# Patient Record
Sex: Female | Born: 1970 | Race: White | Hispanic: No | Smoking: Current every day smoker
Health system: Southern US, Community
[De-identification: ages and names within clinical notes are randomized; demographics above are authoritative.]

## PROBLEM LIST (undated history)

## (undated) DIAGNOSIS — M5430 Sciatica, unspecified side: Secondary | ICD-10-CM

## (undated) DIAGNOSIS — N83209 Unspecified ovarian cyst, unspecified side: Secondary | ICD-10-CM

## (undated) DIAGNOSIS — IMO0001 Reserved for inherently not codable concepts without codable children: Secondary | ICD-10-CM

## (undated) DIAGNOSIS — F909 Attention-deficit hyperactivity disorder, unspecified type: Secondary | ICD-10-CM

## (undated) DIAGNOSIS — T4145XA Adverse effect of unspecified anesthetic, initial encounter: Secondary | ICD-10-CM

## (undated) DIAGNOSIS — M549 Dorsalgia, unspecified: Secondary | ICD-10-CM

## (undated) DIAGNOSIS — F329 Major depressive disorder, single episode, unspecified: Secondary | ICD-10-CM

## (undated) DIAGNOSIS — C801 Malignant (primary) neoplasm, unspecified: Secondary | ICD-10-CM

## (undated) DIAGNOSIS — M1990 Unspecified osteoarthritis, unspecified site: Secondary | ICD-10-CM

## (undated) DIAGNOSIS — R569 Unspecified convulsions: Secondary | ICD-10-CM

## (undated) DIAGNOSIS — G8929 Other chronic pain: Secondary | ICD-10-CM

## (undated) DIAGNOSIS — T8859XA Other complications of anesthesia, initial encounter: Secondary | ICD-10-CM

## (undated) DIAGNOSIS — F32A Depression, unspecified: Secondary | ICD-10-CM

## (undated) DIAGNOSIS — F419 Anxiety disorder, unspecified: Secondary | ICD-10-CM

## (undated) DIAGNOSIS — K219 Gastro-esophageal reflux disease without esophagitis: Secondary | ICD-10-CM

## (undated) HISTORY — PX: ABDOMINAL HYSTERECTOMY: SHX81

## (undated) HISTORY — PX: BLADDER SURGERY: SHX569

## (undated) HISTORY — PX: CHOLECYSTECTOMY: SHX55

---

## 2001-06-10 ENCOUNTER — Inpatient Hospital Stay (HOSPITAL_COMMUNITY): Admission: EM | Admit: 2001-06-10 | Discharge: 2001-06-13 | Payer: Self-pay | Admitting: *Deleted

## 2001-12-09 ENCOUNTER — Encounter: Admission: RE | Admit: 2001-12-09 | Discharge: 2002-03-09 | Payer: Self-pay

## 2002-01-08 ENCOUNTER — Emergency Department (HOSPITAL_COMMUNITY): Admission: EM | Admit: 2002-01-08 | Discharge: 2002-01-08 | Payer: Self-pay | Admitting: Emergency Medicine

## 2007-04-05 ENCOUNTER — Emergency Department (HOSPITAL_COMMUNITY): Admission: EM | Admit: 2007-04-05 | Discharge: 2007-04-05 | Payer: Self-pay | Admitting: Emergency Medicine

## 2007-04-30 ENCOUNTER — Emergency Department (HOSPITAL_COMMUNITY): Admission: EM | Admit: 2007-04-30 | Discharge: 2007-04-30 | Payer: Self-pay | Admitting: *Deleted

## 2007-05-29 ENCOUNTER — Emergency Department (HOSPITAL_COMMUNITY): Admission: EM | Admit: 2007-05-29 | Discharge: 2007-05-29 | Payer: Self-pay | Admitting: Emergency Medicine

## 2007-07-01 ENCOUNTER — Emergency Department (HOSPITAL_COMMUNITY): Admission: EM | Admit: 2007-07-01 | Discharge: 2007-07-01 | Payer: Self-pay | Admitting: Emergency Medicine

## 2007-07-13 ENCOUNTER — Emergency Department (HOSPITAL_COMMUNITY): Admission: EM | Admit: 2007-07-13 | Discharge: 2007-07-13 | Payer: Self-pay | Admitting: Emergency Medicine

## 2007-08-04 ENCOUNTER — Emergency Department (HOSPITAL_COMMUNITY): Admission: EM | Admit: 2007-08-04 | Discharge: 2007-08-04 | Payer: Self-pay | Admitting: Emergency Medicine

## 2007-09-13 ENCOUNTER — Emergency Department (HOSPITAL_COMMUNITY): Admission: EM | Admit: 2007-09-13 | Discharge: 2007-09-13 | Payer: Self-pay | Admitting: *Deleted
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
## 2011-03-13 NOTE — Discharge Summary (Signed)
[facility]  Patient:    Katie Brady, Katie Brady Visit Number: [id number] MRN: [id number]          Service Type: PSY Location: 30 0303 02 Attending Physician:  Denny Peon Dictated by:   Netta Cedars, M.D. Admit Date:  06/10/2001 Discharge Date: 06/13/2001                             Discharge Summary  INTRODUCTION:  Katie Brady Gary is a 40 year old divorced white female mother of 95-year-old twins who lives with her fiancee.  The patient was admitted from [facility] after she overdosed on Soma and some pain relievers.  She denied suicidal intent.  She thought that she simply wanted to ease her chronic pain.  The patient has experienced chronic back pain for over two years after she suffered an injury at work.  The patient admits to feeling down secondary to problems with pain, financial difficulties, and some conflict within the family.  The patient complains of initial insomnia and constant worries.  Denied homicidal or suicidal ideation or hallucinations. The patient denied previous treatment with medication but was under the care of therapist at the mental health center.  She overdosed in January 2002 on medication but she considered this as an accident, not an overdose on purpose. The patient was treated by her neurologist with Xanax and Neurontin.  Her neurologist is Dr. Jerald Kief.  SOCIAL HISTORY:  The patient is unemployed, presently on disability.  Has lived with fiancee for the past two years.  He is supportive but from time to time they do have conflict.  She has some family support from mother.  The patient has some legal problems related to her attempt to obtain prescription narcotics.  FAMILY HISTORY:  Depression and alcoholism both and mothers and fathers side.  SUBSTANCE ABUSE HISTORY:  The patient drinks occasionally.  Marijuana on occasions, not recently.  There is some indication of the patient abusing pain relievers and  muscle relaxants.  MEDICAL HISTORY:  As mentioned, she suffered an injury before to her back and is followed by Dr. Jerald Kief, neurologist, for several years.  PRESENT MEDICATIONS: 1. Keppra. 2. Xanax. 3. Percocet. 4. Neurontin.  DRUG ALLERGIES:  The patient is allergic to SOMA.  PHYSICAL EXAMINATION:  GENERAL:  Done at [facility], was normal.  The patient was recently treated for bronchitis, being on antibiotics for two weeks.  LABORATORY DATA:  There was some elevation of SGOT and SGPT.  MENTAL STATUS EXAMINATION:  A medium built white female who looked her age, casually dressed and groomed, normal motor activity, good eye contact.  Denied hallucinations.  Thoughts were organized and goal directed.  Content did not reveal suicidal, homicidal, or any other dangerous ideations, no delusions, no ideas of reference, no symptoms of OCD.  Mood was described as being okay. Affect was anxious and low.  Alert and oriented x 3.  Normal intelligence. Memory was okay x 3.  Concentration: Decreased.  Insight was partial. Judgment: Impaired.  Reliability: Uncertain.  DIAGNOSES: Axis I:    1. Depressive disorder, not otherwise specified.            2. Rule out depressive disorder due to medical problems, chronic               pain.            3. Polysubstance abuse. Axis II:   Personality disorder, not otherwise  specified. Axis III:  1. Chronic back pain.            2. Status post recent overdose. Axis IV:   Moderate stressors, problems with primary support group, financial            problems, medical problems. Axis V:    Global assessment of functioning upon admission was 40, maximum for            the past year was between 65 and 70.  HOSPITAL COURSE:   The patient was able to contract for safety and she strongly denied suicidal ideations.  Initial problem included organizing a meeting with the patients fiancee to provide a safe environment after discharge and to return  to previous dose of Celexa and Keppra.  Would try to decrease dose of Xanax and Klonopin.  The patient was placed on special observation.  On August 18, dose was increased to 20 mg daily.  In fact, already on August 18, she was doing well, "flying into health," feeling that she did not have problems anymore and that she would be alright.  There was some elevation of SGPT in lab work; otherwise, blood work was normal.  Since the patient denied any dangerous ideations and was doing well, feeling that if family meeting goes alright she could go home the next day.  On August 18, meeting took place.  The patient presented with bright affect, seemed like boyfriend was supportive and understanding to her difficulties.  On August 19, presented with bright affect, absence of dangerous ideations or psychosis, felt ready for discharge, tolerated medication well with no side effects.  She agreed for followup with mental health center.  MEDICAL PROBLEMS DURING THIS HOSPITAL STAY:  The patient did not have medical problems.  Vital signs were stable.  Initially, she ran a low grade fever on August 16 and August 17 but temperature normalized during the next two days of hospitalization.  Blood pressure was 124/7, normal pulse and respiratory rate. There were no signs of physical distress.  LABORATORY DATA:  Slight elevation of ALT 91, normal AST and the rest of liver panel was normal.  Slight elevation of white blood count 13.0 on August 18 and otherwise normal.  TSH normal.  Acute hepatitis panel, anti-HAV, IgM negative; hepatitis B core immunoglobulin negative.  DISCHARGE DIAGNOSES: Axis I:    1. Depressive disorder, not otherwise specified.            2. Polysubstance abuse. Axis II:   Personality disorder, not otherwise specified, with antisocial            traits. Axis III:  1. Chronic back pain.            2. Status post overdose. Axis IV:   Moderate problems related to primary support group  and chronic            medical problems.  Axis V:    Global assessment of functioning upon admission was 40, upon            discharge 60, maximum for the past year was between 65 and 70.  DISCHARGE MEDICATIONS: 1. Keppra 500 mg twice a day. 2. Klonopin 0.5 mg twice a day. 3. Celexa 20 mg daily.  DISCHARGE RECOMMENDATIONS:  The patients boyfriend will secure medications. She should call mental health if problems with medications or recurrence of symptoms.  The patient should follow up with her neurologist.  She has an appointment scheduled in the [place] office with  Dr. Milford Cage on September 30 and [facility] on August 27 with her therapist at 9 a.m.  The patient was supposed to attend NA meetings.  The patient understood instructions, at the time of discharge did not have any side effects from medication and in good condition she was discharged home in the care of her boyfriend. Dictated by:   Netta Cedars, M.D. Attending Physician:  Denny Peon DD:  07/13/01 TD:  07/14/01 Job: [id number]
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
## 2011-06-17 ENCOUNTER — Encounter: Payer: Self-pay | Admitting: *Deleted

## 2011-06-17 ENCOUNTER — Emergency Department (HOSPITAL_COMMUNITY)
Admission: EM | Admit: 2011-06-17 | Discharge: 2011-06-18 | Disposition: A | Discharge status: Home / Self Care | Payer: Medicare Other | Attending: Emergency Medicine | Admitting: Emergency Medicine

## 2011-06-17 DIAGNOSIS — F17200 Nicotine dependence, unspecified, uncomplicated: Secondary | ICD-10-CM | POA: Insufficient documentation

## 2011-06-17 DIAGNOSIS — R05 Cough: Secondary | ICD-10-CM

## 2011-06-17 DIAGNOSIS — R059 Cough, unspecified: Secondary | ICD-10-CM | POA: Insufficient documentation

## 2011-06-17 DIAGNOSIS — B9789 Other viral agents as the cause of diseases classified elsewhere: Secondary | ICD-10-CM | POA: Insufficient documentation

## 2011-06-17 DIAGNOSIS — B349 Viral infection, unspecified: Secondary | ICD-10-CM

## 2011-06-17 NOTE — ED Notes (Signed)
Pt has multiple complaints, pt c/o headache and cough; pt also c/o left lower abd pain x 1 week

## 2011-06-18 ENCOUNTER — Emergency Department (HOSPITAL_COMMUNITY): Payer: Medicare Other

## 2011-06-18 LAB — URINALYSIS, ROUTINE W REFLEX MICROSCOPIC
Hgb urine dipstick: NEGATIVE
Ketones, ur: NEGATIVE mg/dL
Leukocytes, UA: NEGATIVE
Protein, ur: NEGATIVE mg/dL
Urobilinogen, UA: 0.2 mg/dL (ref 0.0–1.0)

## 2011-06-18 MED ORDER — HYDROCODONE-ACETAMINOPHEN 5-325 MG PO TABS
1.0000 | ORAL_TABLET | Freq: Once | ORAL | Status: AC
  Administered 2011-06-18: 1 via ORAL

## 2011-06-18 MED ORDER — ALBUTEROL SULFATE (5 MG/ML) 0.5% IN NEBU
2.5000 mg | INHALATION_SOLUTION | Freq: Once | RESPIRATORY_TRACT | Status: AC
  Administered 2011-06-18: 2.5 mg via RESPIRATORY_TRACT

## 2011-06-18 MED ORDER — GUAIFENESIN-CODEINE 100-10 MG/5ML PO SYRP: 5.0000 mL | ORAL_SOLUTION | Freq: Three times a day (TID) | ORAL | Status: AC | PRN

## 2011-06-18 MED ORDER — GUAIFENESIN-CODEINE 100-10 MG/5ML PO SOLN
5.0000 mL | Freq: Once | ORAL | Status: AC
  Administered 2011-06-18: 5 mL via ORAL

## 2011-06-18 MED ORDER — IBUPROFEN 800 MG PO TABS
800.0000 mg | ORAL_TABLET | Freq: Once | ORAL | Status: AC
  Administered 2011-06-18: 800 mg via ORAL
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
## 2011-08-04 LAB — URINALYSIS, ROUTINE W REFLEX MICROSCOPIC
Bilirubin Urine: NEGATIVE
Glucose, UA: NEGATIVE
Ketones, ur: 15 — AB
Leukocytes, UA: NEGATIVE
Protein, ur: NEGATIVE
pH: 6

## 2011-08-04 LAB — URINE MICROSCOPIC-ADD ON

## 2011-08-06 LAB — URINALYSIS, ROUTINE W REFLEX MICROSCOPIC
Bilirubin Urine: NEGATIVE
Glucose, UA: NEGATIVE
Glucose, UA: NEGATIVE
Nitrite: NEGATIVE
Nitrite: NEGATIVE
Specific Gravity, Urine: 1.01
pH: 7

## 2011-08-06 LAB — URINE MICROSCOPIC-ADD ON

## 2011-08-07 LAB — DIFFERENTIAL
Basophils Absolute: 0.1
Basophils Relative: 1
Eosinophils Absolute: 0.1
Eosinophils Relative: 1
Lymphocytes Relative: 29
Lymphs Abs: 2
Monocytes Absolute: 0.4
Monocytes Relative: 6
Neutro Abs: 4.3
Neutrophils Relative %: 63

## 2011-08-07 LAB — URINALYSIS, ROUTINE W REFLEX MICROSCOPIC
Bilirubin Urine: NEGATIVE
Ketones, ur: NEGATIVE
Nitrite: NEGATIVE
Protein, ur: NEGATIVE
Specific Gravity, Urine: <1.005 — ABNORMAL LOW
Urobilinogen, UA: 0.2

## 2011-08-07 LAB — CBC
Hemoglobin: 12.4
MCHC: 34.5
RDW: 14.4 — ABNORMAL HIGH

## 2011-08-07 LAB — COMPREHENSIVE METABOLIC PANEL
ALT: 22
Calcium: 9
Creatinine, Ser: 0.63
GFR calc Af Amer: >60
Glucose, Bld: 93
Sodium: 138
Total Protein: 6.4

## 2011-08-07 LAB — PREGNANCY, URINE: Preg Test, Ur: NEGATIVE

## 2011-08-07 LAB — LIPASE, BLOOD: Lipase: 15

## 2011-08-07 LAB — URINE MICROSCOPIC-ADD ON

## 2011-08-10 LAB — BASIC METABOLIC PANEL
CO2: 25
Calcium: 8.7
Creatinine, Ser: 0.75
GFR calc Af Amer: >60
GFR calc non Af Amer: >60
Glucose, Bld: 109 — ABNORMAL HIGH

## 2011-08-10 LAB — CBC
HCT: 38
Hemoglobin: 13.1
MCHC: 34.4
MCV: 93.2
Platelets: 275
RBC: 4.07
RDW: 14
WBC: 7.3

## 2011-08-10 LAB — URINALYSIS, ROUTINE W REFLEX MICROSCOPIC
Bilirubin Urine: NEGATIVE
Glucose, UA: NEGATIVE
Ketones, ur: NEGATIVE
Leukocytes, UA: NEGATIVE
Nitrite: NEGATIVE
Protein, ur: NEGATIVE
Specific Gravity, Urine: <1.005 — ABNORMAL LOW
Urobilinogen, UA: 0.2
pH: 6

## 2011-08-10 LAB — DIFFERENTIAL
Basophils Absolute: 0.1
Basophils Relative: 1
Lymphocytes Relative: 27
Neutro Abs: 4.9
Neutrophils Relative %: 68

## 2011-08-10 LAB — URINE MICROSCOPIC-ADD ON

## 2011-08-10 LAB — PREGNANCY, URINE: Preg Test, Ur: NEGATIVE

## 2011-08-11 LAB — BASIC METABOLIC PANEL
GFR calc non Af Amer: >60
Potassium: 3.8
Sodium: 136

## 2011-08-11 LAB — CBC
HCT: 40.1
Hemoglobin: 13.8
RBC: 4.34
WBC: 9.3

## 2011-08-11 LAB — DIFFERENTIAL
Eosinophils Relative: 0
Lymphocytes Relative: 22
Lymphs Abs: 2
Monocytes Absolute: 0.4
Monocytes Relative: 4

## 2011-08-11 LAB — PREGNANCY, URINE: Preg Test, Ur: NEGATIVE

## 2011-08-11 LAB — URINALYSIS, ROUTINE W REFLEX MICROSCOPIC
Bilirubin Urine: NEGATIVE
Nitrite: NEGATIVE
Specific Gravity, Urine: <1.005 — ABNORMAL LOW
pH: 6.5

## 2011-08-11 LAB — HEPATIC FUNCTION PANEL
AST: 12
Bilirubin, Direct: 0.1
Indirect Bilirubin: 0.3

## 2011-08-11 LAB — URINE MICROSCOPIC-ADD ON

## 2011-08-13 LAB — URINALYSIS, ROUTINE W REFLEX MICROSCOPIC
Ketones, ur: NEGATIVE
Nitrite: NEGATIVE
Protein, ur: NEGATIVE
Urobilinogen, UA: 0.2
pH: 6

## 2011-08-13 LAB — CBC
HCT: 36.3
MCHC: 35
MCV: 92.3
Platelets: 254
RDW: 13.6

## 2011-08-13 LAB — DIFFERENTIAL
Basophils Absolute: 0.1
Eosinophils Absolute: 0.1
Eosinophils Relative: 1

## 2011-08-13 LAB — PREGNANCY, URINE: Preg Test, Ur: NEGATIVE

## 2012-02-23 ENCOUNTER — Encounter (HOSPITAL_COMMUNITY): Payer: Self-pay | Admitting: Emergency Medicine

## 2012-02-23 ENCOUNTER — Emergency Department (HOSPITAL_COMMUNITY)
Admission: EM | Admit: 2012-02-23 | Discharge: 2012-02-24 | Disposition: A | Discharge status: Home / Self Care | Payer: Medicare Other | Attending: Emergency Medicine | Admitting: Emergency Medicine

## 2012-02-23 DIAGNOSIS — Z8543 Personal history of malignant neoplasm of ovary: Secondary | ICD-10-CM | POA: Insufficient documentation

## 2012-02-23 DIAGNOSIS — Z79899 Other long term (current) drug therapy: Secondary | ICD-10-CM | POA: Insufficient documentation

## 2012-02-23 DIAGNOSIS — F3289 Other specified depressive episodes: Secondary | ICD-10-CM | POA: Insufficient documentation

## 2012-02-23 DIAGNOSIS — M069 Rheumatoid arthritis, unspecified: Secondary | ICD-10-CM | POA: Insufficient documentation

## 2012-02-23 DIAGNOSIS — IMO0002 Reserved for concepts with insufficient information to code with codable children: Secondary | ICD-10-CM | POA: Insufficient documentation

## 2012-02-23 DIAGNOSIS — F329 Major depressive disorder, single episode, unspecified: Secondary | ICD-10-CM | POA: Insufficient documentation

## 2012-02-23 DIAGNOSIS — S1091XA Abrasion of unspecified part of neck, initial encounter: Secondary | ICD-10-CM

## 2012-02-23 DIAGNOSIS — X789XXA Intentional self-harm by unspecified sharp object, initial encounter: Secondary | ICD-10-CM | POA: Insufficient documentation

## 2012-02-23 DIAGNOSIS — Z639 Problem related to primary support group, unspecified: Secondary | ICD-10-CM | POA: Insufficient documentation

## 2012-02-23 DIAGNOSIS — R4589 Other symptoms and signs involving emotional state: Secondary | ICD-10-CM

## 2012-02-23 DIAGNOSIS — G40909 Epilepsy, unspecified, not intractable, without status epilepticus: Secondary | ICD-10-CM | POA: Insufficient documentation

## 2012-02-23 DIAGNOSIS — F489 Nonpsychotic mental disorder, unspecified: Secondary | ICD-10-CM | POA: Insufficient documentation

## 2012-02-23 HISTORY — DX: Major depressive disorder, single episode, unspecified: F32.9

## 2012-02-23 HISTORY — DX: Unspecified convulsions: R56.9

## 2012-02-23 HISTORY — DX: Depression, unspecified: F32.A

## 2012-02-23 LAB — URINALYSIS, ROUTINE W REFLEX MICROSCOPIC
Bilirubin Urine: NEGATIVE
Glucose, UA: NEGATIVE mg/dL
Hgb urine dipstick: NEGATIVE
Ketones, ur: NEGATIVE mg/dL
Leukocytes, UA: NEGATIVE
Nitrite: NEGATIVE
Protein, ur: NEGATIVE mg/dL
Specific Gravity, Urine: <1.005 — ABNORMAL LOW (ref 1.005–1.030)
Urobilinogen, UA: 0.2 mg/dL (ref 0.0–1.0)
pH: 6 (ref 5.0–8.0)

## 2012-02-23 LAB — CBC
HCT: 37.9 % (ref 36.0–46.0)
Hemoglobin: 12.9 g/dL (ref 12.0–15.0)
MCH: 31.7 pg (ref 26.0–34.0)
MCHC: 34 g/dL (ref 30.0–36.0)
MCV: 93.1 fL (ref 78.0–100.0)
Platelets: 275 10*3/uL (ref 150–400)
RBC: 4.07 MIL/uL (ref 3.87–5.11)
RDW: 13.9 % (ref 11.5–15.5)
WBC: 12 10*3/uL — ABNORMAL HIGH (ref 4.0–10.5)

## 2012-02-23 LAB — RAPID URINE DRUG SCREEN, HOSP PERFORMED
Amphetamines: NOT DETECTED
Barbiturates: NOT DETECTED
Benzodiazepines: POSITIVE — AB
Cocaine: NOT DETECTED
Opiates: NOT DETECTED
Tetrahydrocannabinol: NOT DETECTED

## 2012-02-23 LAB — BASIC METABOLIC PANEL
BUN: 10 mg/dL (ref 6–23)
CO2: 26 mEq/L (ref 19–32)
Calcium: 9.1 mg/dL (ref 8.4–10.5)
Chloride: 106 mEq/L (ref 96–112)
Creatinine, Ser: 0.84 mg/dL (ref 0.50–1.10)
GFR calc Af Amer: >90 mL/min (ref >90)
GFR calc non Af Amer: 84 mL/min — ABNORMAL LOW (ref >90)
Glucose, Bld: 87 mg/dL (ref 70–99)
Potassium: 3 mEq/L — ABNORMAL LOW (ref 3.5–5.1)
Sodium: 143 mEq/L (ref 135–145)

## 2012-02-23 LAB — ETHANOL: Alcohol, Ethyl (B): 96 mg/dL — ABNORMAL HIGH (ref 0–11)

## 2012-02-23 MED ORDER — ARIPIPRAZOLE 10 MG PO TABS
ORAL_TABLET | ORAL | Status: AC
  Filled 2012-02-23: qty 1

## 2012-02-23 MED ORDER — ARIPIPRAZOLE 10 MG PO TABS
10.0000 mg | ORAL_TABLET | Freq: Every day | ORAL | Status: DC
  Administered 2012-02-23 – 2012-02-24 (×2): 10 mg via ORAL
  Filled 2012-02-23 (×3): qty 1

## 2012-02-23 MED ORDER — LEVETIRACETAM 500 MG PO TABS
1000.0000 mg | ORAL_TABLET | Freq: Every day | ORAL | Status: DC
Start: 2012-02-23 — End: 2012-02-24
  Administered 2012-02-23 – 2012-02-24 (×2): 1000 mg via ORAL
  Filled 2012-02-23 (×3): qty 2

## 2012-02-23 MED ORDER — LINDANE 1 % EX LOTN
TOPICAL_LOTION | CUTANEOUS | Status: AC
  Filled 2012-02-23: qty 60

## 2012-02-23 MED ORDER — ALPRAZOLAM 0.5 MG PO TABS
1.0000 mg | ORAL_TABLET | Freq: Four times a day (QID) | ORAL | Status: DC
  Administered 2012-02-23 – 2012-02-24 (×3): 1 mg via ORAL
  Filled 2012-02-23: qty 1
  Filled 2012-02-23 (×2): qty 2
  Filled 2012-02-23: qty 1

## 2012-02-23 MED ORDER — ZOLPIDEM TARTRATE 5 MG PO TABS: 5.0000 mg | ORAL_TABLET | Freq: Every evening | ORAL | Status: DC | PRN

## 2012-02-23 MED ORDER — LINDANE 1 % EX SHAM
MEDICATED_SHAMPOO | CUTANEOUS | Status: AC
  Filled 2012-02-23: qty 60

## 2012-02-23 MED ORDER — LORAZEPAM 1 MG PO TABS: 1.0000 mg | ORAL_TABLET | Freq: Three times a day (TID) | ORAL | Status: DC | PRN

## 2012-02-23 MED ORDER — AMPHETAMINE-DEXTROAMPHETAMINE 10 MG PO TABS
30.0000 mg | ORAL_TABLET | Freq: Every day | ORAL | Status: DC
Start: 2012-02-24 — End: 2012-02-24
  Administered 2012-02-24: 30 mg via ORAL
  Filled 2012-02-23 (×2): qty 3

## 2012-02-23 MED ORDER — POTASSIUM CHLORIDE CRYS ER 20 MEQ PO TBCR
40.0000 meq | EXTENDED_RELEASE_TABLET | Freq: Once | ORAL | Status: AC
  Administered 2012-02-23: 40 meq via ORAL
  Filled 2012-02-23: qty 2

## 2012-02-23 MED ORDER — ONDANSETRON HCL 4 MG PO TABS: 4.0000 mg | ORAL_TABLET | Freq: Three times a day (TID) | ORAL | Status: DC | PRN

## 2012-02-23 MED ORDER — LEVETIRACETAM 500 MG PO TABS
ORAL_TABLET | ORAL | Status: AC
  Filled 2012-02-23: qty 2

## 2012-02-23 MED ORDER — ACETAMINOPHEN 325 MG PO TABS: 650.0000 mg | ORAL_TABLET | ORAL | Status: DC | PRN

## 2012-02-23 MED ORDER — LINDANE 1 % EX SHAM
MEDICATED_SHAMPOO | Freq: Once | CUTANEOUS | Status: AC
  Administered 2012-02-23: 21:00:00 via TOPICAL
  Filled 2012-02-23: qty 60
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
## 2012-02-23 NOTE — ED Notes (Signed)
Called AC to bring patient's medication.
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
## 2012-02-23 NOTE — ED Notes (Addendum)
Patient standing in hallway. Advised patient she needed to return to her room and stay in her room per HIPPA violation. Patient returned to room with no difficulty and is now sitting in bed.

## 2012-02-23 NOTE — ED Notes (Signed)
Patient requesting something to eat. Patient advocate getting patient a frozen dinner. Patient states she will try to sleep after she gets something to eat.

## 2012-02-23 NOTE — ED Notes (Signed)
Patient has lice knits in hair, small bites to back of neck. Patient states "I don't think so" when asked if she had lice in her hair. Patient also has 2 closed lacerations approximately 3-4 inches long. Patient denies cutting herself. No bleeding noted from lacerations.

## 2012-02-23 NOTE — ED Notes (Signed)
Notified by front desk that patient's mother and sister were here wanting to see patient. Patient stated she does not have a sister but wanted to see her mother. Security escorted a young lady to nursing station and lady stated she was patient's daughter. Denied daughter access to her mother due to comments made by patient being the reason she is here. Mother allowed to see patient.
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
## 2012-02-24 NOTE — ED Notes (Signed)
Patient states she wants her medicine RN Amber aware.

## 2012-02-24 NOTE — ED Notes (Signed)
Patient given frozen dinner and sprite. Patient sitting in bed eating at this time.

## 2012-02-24 NOTE — BH Assessment (Signed)
Assessment Note   Katie Brady is an 41 y.o. female. The patient came to the ED last night, after she had made a gesture of cutting her throat with a broken window pane. She denies that she wanted to kill herself. She was distraught that her daughter told her that she was romantically involved with the patients' live in boyfriend of 17 years. In her anger she had broken the window. The patient as a rule only drinks occassionally, but to calm down she had drank a 24 oz beer. The patient denies a history of substance abuse. She is not homicidal and denies any history of violence. She is not psychotic. She has no history of inpatient treatment. She is followed by Dr. Carroll Sage for medications. 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 The patient denies any suicidal ideation. She is willing to contract for safety.  Axis I: Major Depression, Recurrent severe Axis II: Deferred Axis III:  Past Medical History  Diagnosis Date  . Migraine   . Ovarian cancer   . Rheumatoid aortitis   . Depression   . Seizures    Axis IV: other psychosocial or environmental problems and problems with primary support group Axis V: 51-60 moderate symptoms  Past Medical History:  Past Medical History  Diagnosis Date  . Migraine   . Ovarian cancer   . Rheumatoid aortitis   . Depression   . Seizures     Past Surgical History  Procedure Date  . Abdominal hysterectomy   . Bladder surgery   . Cholecystectomy     Family History: History reviewed. No pertinent family history.  Social History:  reports that she has been smoking.  She does not have any smokeless tobacco history on file. She reports that she drinks alcohol. She reports that she does not use illicit drugs.  Additional Social History:  Alcohol / Drug Use Pain Medications: denies Prescriptions: denies Over the Counter: denies History of alcohol / drug use?:  (drinks occassionally--does not drink around  grandson) Longest period of sobriety (when/how long): na Allergies:  Allergies  Allergen Reactions  . Darvocet (Propoxyphene-Acetaminophen)   . Lodine (Etodolac)     Home Medications:  (Not in a hospital admission)  OB/GYN Status:  No LMP recorded. Patient has had a hysterectomy.  General Assessment Data Location of Assessment: [facility] ACT Assessment: Yes Living Arrangements: Spouse/significant other;Other relatives (grandson) Can pt return to current living arrangement?: Yes Admission Status: Voluntary Is patient capable of signing voluntary admission?: Yes Transfer from: Acute Hospital Referral Source: Self/Family/Friend  Education Status Is patient currently in school?: No  Risk to self Suicidal Intent: No Is patient at risk for suicide?: No Suicidal Plan?: No (impulsively scratched at throat last night) Access to Means: No What has been your use of drugs/alcohol within the last 12 months?: 1 24 oz beer last night Previous Attempts/Gestures: No How many times?: 0  Other Self Harm Risks: denies Triggers for Past Attempts: None known Intentional Self Injurious Behavior: None Family Suicide History: Yes (daughter) Recent stressful life event(s): Conflict (Comment);Turmoil (Comment) (bipolar daughter moved back home and created problems with i) Persecutory voices/beliefs?: No Depression: Yes Depression Symptoms: Tearfulness;Fatigue;Loss of interest in usual pleasures;Feeling worthless/self pity Substance abuse history and/or treatment for substance abuse?: No Suicide prevention information given to non-admitted patients: Yes  Risk to Others Homicidal Ideation: No Thoughts of Harm to Others: No Current Homicidal Intent: No Current Homicidal Plan: No Access to Homicidal  Means: No History of harm to others?: No Does patient have access to weapons?: No Criminal Charges Pending?: No Does patient have a court date: No  Psychosis Hallucinations: None noted Delusions:  None noted  Mental Status Report Appear/Hygiene: Disheveled Eye Contact: Fair Motor Activity: Freedom of movement Speech: Logical/coherent Level of Consciousness: Quiet/awake;Alert Mood: Depressed;Sad Affect: Anxious;Depressed;Blunted Anxiety Level: Minimal Thought Processes: Coherent;Relevant Judgement: Unimpaired Orientation: Person;Place;Time;Situation Obsessive Compulsive Thoughts/Behaviors: None  Cognitive Functioning Concentration: Decreased Memory: Recent Intact;Remote Intact IQ: Average Insight: Fair Impulse Control: Fair Appetite: Fair Sleep: No Change Vegetative Symptoms: None  Prior Inpatient Therapy Prior Inpatient Therapy: No  Prior Outpatient Therapy Prior Outpatient Therapy: Yes Prior Therapy Dates: current Prior Therapy Facilty/Provider(s): Carroll Sage MD/psychiatrist Reason for Treatment: depression  ADL Screening (condition at time of admission) Patient's cognitive ability adequate to safely complete daily activities?: Yes Patient able to express need for assistance with ADLs?: Yes Independently performs ADLs?: Yes Weakness of Legs: None Weakness of Arms/Hands: None  Home Assistive Devices/Equipment Home Assistive Devices/Equipment: None      Values / Beliefs Cultural Requests During Hospitalization: None Spiritual Requests During Hospitalization: None     Nutrition Screen Diet: Regular  Additional Information 1:1 In Past 12 Months?: No CIRT Risk: No Elopement Risk: No Does patient have medical clearance?: Yes     Disposition: Patient was seen for tele-psych. Patient did not require inpatient treatment. Patient contracted for safety. Patient discharged with follow up to continue with Carroll Sage MD. Dr Ignacia Palma in agreement with  This disposition. Disposition Disposition of Patient: Outpatient treatment;Referred to Patient referred to: Outpatient clinic referral (continue follow up with Dr Tiburcio Pea)  On Site Evaluation by:     Reviewed with Physician:     Jake Shark [facility] 02/24/2012 10:10 AM

## 2012-02-24 NOTE — ED Notes (Signed)
Patient lying in bed sleeping. Rise and fall of chest noted. Sitter at bedside. No obvious distress noted at this time. 

## 2012-02-24 NOTE — ED Notes (Signed)
Pt requesting oxycodone for pain.  Notified edp

## 2012-02-24 NOTE — ED Notes (Signed)
Pt participating in telepsych consult 

## 2012-02-24 NOTE — ED Notes (Signed)
Family at bedside. Patient would like her xanx states she needs something for back pain.

## 2012-02-24 NOTE — ED Notes (Signed)
Husband called to talk with pt.  Pt talking to him on phone.  Pt seems calm.  nad noted

## 2012-02-24 NOTE — Discharge Instructions (Signed)
Abrasions Abrasions are skin scrapes. Their treatment depends on how large and deep the abrasion is. Abrasions do not extend through all layers of the skin. A cut or lesion through all skin layers is called a laceration. HOME CARE INSTRUCTIONS   If you were given a dressing, change it at least once a day or as instructed by your caregiver. If the bandage sticks, soak it off with a solution of water or hydrogen peroxide.   Twice a day, wash the area with soap and water to remove all the cream/ointment. You may do this in a sink, under a tub faucet, or in a shower. Rinse off the soap and pat dry with a clean towel. Look for signs of infection (see below).   Reapply cream/ointment according to your caregiver's instruction. This will help prevent infection and keep the bandage from sticking. Telfa or gauze over the wound and under the dressing or wrap will also help keep the bandage from sticking.   If the bandage becomes wet, dirty, or develops a foul smell, change it as soon as possible.   Only take over-the-counter or prescription medicines for pain, discomfort, or fever as directed by your caregiver.  SEEK IMMEDIATE MEDICAL CARE IF:   Increasing pain in the wound.   Signs of infection develop: redness, swelling, surrounding area is tender to touch, or pus coming from the wound.   You have a fever.   Any foul smell coming from the wound or dressing.  Most skin wounds heal within ten days. Facial wounds heal faster. However, an infection may occur despite proper treatment. You should have the wound checked for signs of infection within 24 to 48 hours or sooner if problems arise. If you were not given a wound-check appointment, look closely at the wound yourself on the second day for early signs of infection listed above. MAKE SURE YOU:   Understand these instructions.   Will watch your condition.   Will get help right away if you are not doing well or get worse.  Document Released:  07/22/2005 Document Revised: 10/01/2011 Document Reviewed: 09/15/2011 [facility] Patient Information 2012 [place], [place].Suicidal Feelings, How to Help Yourself Everyone feels sad or unhappy at times, but depressing thoughts and feelings of hopelessness can lead to thoughts of suicide. It can seem as if life is too tough to handle. It is as if the mountain is just too high and your climbing skills are not great enough. At that moment these dark thoughts and feelings may seem overwhelming and never ending. It is important to remember these feelings are temporary! They will go away. If you feel as though you have reached the point where suicide is the only answer, it is time to let someone know immediately. This is the first step to feeling better. The following steps will move you to safer ground and lead you in a positive direction out of depression. HOW TO COPE AND PREVENT SUICIDE  Let family, friends, teachers and/or counselors know. Get help. Try not to isolate yourself from those who care about you. Even though you may not feel sociable or think that you are not good company, talk with someone everyday. It is best if it is face to face. Remember, they will want to help you.   Eat a regularly spaced and well-balanced diet, and get plenty of rest.   Avoid alcohol and drugs because they will only make you feel worse and may also lower your inhibitions. Remove them from the home. If  you are thinking of taking an overdose of your prescribed medications, give your medicines to someone who can give them to you one day at a time. If you are on antidepressants, let your caregiver know of your feelings so he or she can provide a safer medication, if that is a concern.   Remove weapons or poisons from your home.   Try to stick to routines. That may mean just walking the dog or feeding the cat. Follow a schedule and remind yourself that you have to keep that schedule every day. Play with your pets. If it is  possible, and you do not have a pet, get one. They give you a sense of well-being, lower your blood pressure and make your heart feel good. They need you, and we all want to be needed.   Set some realistic goals and achieve them. Make a list and cross things off as you go. Accomplishments give a sense of worth. Wait until you are feeling better before doing things you find difficult or unpleasant to do.   If you are able, try to start exercising. Even half-hour periods of exercise each day will make you feel better. Getting out in the sun or into nature helps you recover from depression faster. If you have a favorite place to walk, take advantage of that.   Increase safe activities that have always given you pleasure. This may include playing your favorite music, reading a good book, painting a picture or playing your favorite instrument. Do whatever takes your mind off your depression and puts a smile on your face.   Keep your living space well lit with windows open, and let the sun shine in. Bright light definitely treats depression, not just people with the seasonal affective disorders (SAD).  Above all else remember, depression is temporary. It will go away. Do not contemplate suicide. Death as a permanent solution is not the answer. Suicide will take away the beautiful rest of life, and do lifelong harm to those around you who love you. Help is available. National Suicide Help Lines with 24 hour help are: 1-800-SUICIDE 919-781-2325 Document Released: 04/18/2003 Document Revised: 10/01/2011 Document Reviewed: 09/06/2007 [facility] Patient Information 2012 [place], [place].Stress Management Stress is a state of physical or mental tension that often results from changes in your life or normal routine. Some common causes of stress are:  Death of a loved one.   Injuries or severe illnesses.   Getting fired or changing jobs.   Moving into a new home.  Other causes may be:  Sexual problems.     Business or financial losses.   Taking on a large debt.   Regular conflict with someone at home or at work.   Constant tiredness from lack of sleep.  It is not just bad things that are stressful. It may be stressful to:  Win the lottery.   Get married.   Buy a new car.  The amount of stress that can be easily tolerated varies from person to person. Changes generally cause stress, regardless of the types of change. Too much stress can affect your health. It may lead to physical or emotional problems. Too little stress (boredom) may also become stressful. SUGGESTIONS TO REDUCE STRESS:  Talk things over with your family and friends. It often is helpful to share your concerns and worries. If you feel your problem is serious, you may want to get help from a professional counselor.   Consider your problems one at a time  instead of lumping them all together. Trying to take care of everything at once may seem impossible. List all the things you need to do and then start with the most important one. Set a goal to accomplish 2 or 3 things each day. If you expect to do too many in a single day you will naturally fail, causing you to feel even more stressed.   Do not use alcohol or drugs to relieve stress. Although you may feel better for a short time, they do not remove the problems that caused the stress. They can also be habit forming.   Exercise regularly - at least 3 times per week. Physical exercise can help to relieve that "uptight" feeling and will relax you.   The shortest distance between despair and hope is often a good night's sleep.   Go to bed and get up on time allowing yourself time for appointments without being rushed.   Take a short "time-out" period from any stressful situation that occurs during the day. Close your eyes and take some deep breaths. Starting with the muscles in your face, tense them, hold it for a few seconds, then relax. Repeat this with the muscles in your  neck, shoulders, hand, stomach, back and legs.   Take good care of yourself. Eat a balanced diet and get plenty of rest.   Schedule time for having fun. Take a break from your daily routine to relax.  HOME CARE INSTRUCTIONS   Call if you feel overwhelmed by your problems and feel you can no longer manage them on your own.   Return immediately if you feel like hurting yourself or someone else.  Document Released: 04/07/2001 Document Revised: 10/01/2011 Document Reviewed: 11/28/2007 [facility] Patient Information 2012 [place], [place].  RESOURCE GUIDE  Dental Problems  Patients with Medicaid: [facility] [phone] [address]                                           [phone] [address] Phone:  [phone]                                                  Phone:  [phone]  If unable to pay or uninsured, contact:  [facility] or [facility]. to become qualified for the adult dental clinic.  Chronic Pain Problems Contact [facility]  [phone] Patients need to be referred by their primary care doctor.  Insufficient Money for Medicine Contact [employer]:  call "211" or [facility] [phone].  No Primary Care Doctor Call [facility]  [phone] Other agencies that provide inexpensive medical care    [facility]  [phone]    [facility]  [phone]    [facility]  [phone]    [facility]  [phone]    [facility]  [phone]    [facility]  [phone]  Psychological Services [facility] Behavioral Health  [phone] [facility]  [phone] [facility]   [phone] (emergency services [phone])  Substance Abuse Resources [facility]  [phone] [facility] [phone] [facility] [phone] Floydene Flock [phone] Residential & Outpatient Substance Abuse Program   [phone]  Abuse/Neglect [facility] Child Abuse Hotline 786-598-4390 [facility] Child Abuse Hotline 681-712-2924 (After Hours)  Emergency Shelter [facility] [phone]  Maternity Homes [facility] at the [place] [facility] [phone] [address] Alert Services [phone]  MRSA Hotline #:   510-776-8005    [facility] Resources  [facility]     [employer]                          [facility] Dept. [address] [place]                       [address]      [address]  Blondell Reveal Phone:  [phone]                                   Phone:  [phone]                 Phone:  [phone]  [facility] Phone:  [phone]  [facility] Child Abuse Hotline 858-546-5426 [phone] (After Hours)

## 2012-02-24 NOTE — ED Notes (Signed)
Patient finished eating meal and requested blanket. Blanket given and patient lying in bed at this time.

## 2012-07-16 ENCOUNTER — Encounter (HOSPITAL_COMMUNITY): Payer: Self-pay | Admitting: *Deleted

## 2012-07-16 ENCOUNTER — Emergency Department (HOSPITAL_COMMUNITY)
Admission: EM | Admit: 2012-07-16 | Discharge: 2012-07-16 | Disposition: A | Discharge status: Home / Self Care | Payer: Medicare Other | Attending: Emergency Medicine | Admitting: Emergency Medicine

## 2012-07-16 DIAGNOSIS — G8929 Other chronic pain: Secondary | ICD-10-CM | POA: Insufficient documentation

## 2012-07-16 DIAGNOSIS — M129 Arthropathy, unspecified: Secondary | ICD-10-CM | POA: Insufficient documentation

## 2012-07-16 DIAGNOSIS — M545 Low back pain, unspecified: Secondary | ICD-10-CM | POA: Insufficient documentation

## 2012-07-16 DIAGNOSIS — F17200 Nicotine dependence, unspecified, uncomplicated: Secondary | ICD-10-CM | POA: Insufficient documentation

## 2012-07-16 HISTORY — DX: Sciatica, unspecified side: M54.30

## 2012-07-16 HISTORY — DX: Unspecified osteoarthritis, unspecified site: M19.90

## 2012-07-16 HISTORY — DX: Dorsalgia, unspecified: M54.9

## 2012-07-16 HISTORY — DX: Other chronic pain: G89.29

## 2012-07-16 HISTORY — DX: Unspecified ovarian cyst, unspecified side: N83.209

## 2012-07-16 MED ORDER — ACETAMINOPHEN 500 MG PO TABS
1000.0000 mg | ORAL_TABLET | Freq: Once | ORAL | Status: AC
  Administered 2012-07-16: 1000 mg via ORAL

## 2012-07-16 MED ORDER — IBUPROFEN 400 MG PO TABS
400.0000 mg | ORAL_TABLET | Freq: Once | ORAL | Status: AC
  Administered 2012-07-16: 400 mg via ORAL

## 2012-07-16 MED ORDER — ACETAMINOPHEN 500 MG PO TABS
ORAL_TABLET | ORAL | Status: AC
  Filled 2012-07-16: qty 2

## 2012-07-16 MED ORDER — OXYCODONE-ACETAMINOPHEN 5-325 MG PO TABS: ORAL_TABLET | ORAL | Status: DC

## 2012-07-16 MED ORDER — IBUPROFEN 400 MG PO TABS
ORAL_TABLET | ORAL | Status: AC
  Filled 2012-07-16: qty 1

## 2012-07-16 MED ORDER — METHOCARBAMOL 500 MG PO TABS: 1000.0000 mg | ORAL_TABLET | Freq: Four times a day (QID) | ORAL | Status: DC | PRN

## 2012-07-16 NOTE — ED Provider Notes (Signed)
History     CSN: [id number]  Arrival date & time 07/16/12  0244   First MD Initiated Contact with Patient 07/16/12 0304      Chief Complaint  Patient presents with  . Back Pain    HPI Pt was seen at 0320.  Per pt, c/o gradual onset and persistence of constant acute flair of her chronic low back "pain" for the past week.  Pain began after she helped a friend lift several hay bales.  Denies any change in her usual chronic pain pattern for the last 10+ years.  Pain worsens with palpation of the area and body position changes. Denies incont/retention of bowel or bladder, no saddle anesthesia, no focal motor weakness, no tingling/numbness in extremities, no fevers, no injury, no abd pain.   The symptoms have been associated with no other complaints. The patient has a significant history of similar symptoms previously, recently being evaluated for this complaint and multiple prior evals for same.    Past Medical History  Diagnosis Date  . Migraine   . Depression   . Seizures   . Chronic back pain   . Sciatica     right  . Arthritis   . Ovarian cyst     Past Surgical History  Procedure Date  . Abdominal hysterectomy   . Bladder surgery   . Cholecystectomy     History  Substance Use Topics  . Smoking status: Current Every Day Smoker -- 0.5 packs/day  . Smokeless tobacco: Not on file  . Alcohol Use: Yes     occasionally    Review of Systems ROS: Statement: All systems negative except as marked or noted in the HPI; Constitutional: Negative for fever and chills. ; ; Eyes: Negative for eye pain, redness and discharge. ; ; ENMT: Negative for ear pain, hoarseness, nasal congestion, sinus pressure and sore throat. ; ; Cardiovascular: Negative for chest pain, palpitations, diaphoresis, dyspnea and peripheral edema. ; ; Respiratory: Negative for cough, wheezing and stridor. ; ; Gastrointestinal: Negative for nausea, vomiting, diarrhea, abdominal pain, blood in stool, hematemesis, jaundice  and rectal bleeding. . ; ; Genitourinary: Negative for dysuria, flank pain and hematuria. ; ; Musculoskeletal: +LBP. Negative for neck pain. Negative for swelling and trauma.; ; Skin: Negative for pruritus, rash, abrasions, blisters, bruising and skin lesion.; ; Neuro: Negative for headache, lightheadedness and neck stiffness. Negative for weakness, altered level of consciousness , altered mental status, extremity weakness, paresthesias, involuntary movement, seizure and syncope.       Allergies  Darvocet and Lodine  Home Medications   Current Outpatient Rx  Name Route Sig Dispense Refill  . ESCITALOPRAM OXALATE 20 MG PO TABS Oral Take 20 mg by mouth daily.    Marland Kitchen ALPRAZOLAM 1 MG PO TABS Oral Take 1 mg by mouth 4 (four) times daily.    . AMPHETAMINE-DEXTROAMPHETAMINE 30 MG PO TABS Oral Take 30 mg by mouth daily.      . ARIPIPRAZOLE 10 MG PO TABS Oral Take 10 mg by mouth daily.    Marland Kitchen PREMARIN PO Oral Take 1 tablet by mouth daily.     Marland Kitchen HYDROCODONE-ACETAMINOPHEN 10-325 MG PO TABS Oral Take 1 tablet by mouth every 6 (six) hours as needed. For pain    . LEVETIRACETAM 1000 MG PO TABS Oral Take 1,000 mg by mouth daily.      Marland Kitchen METHOCARBAMOL 500 MG PO TABS Oral Take 2 tablets (1,000 mg total) by mouth 4 (four) times daily as needed (muscle spasm/pain). 25  tablet 0  . OXYCODONE-ACETAMINOPHEN 5-325 MG PO TABS  1 or 2 tabs PO q6h prn pain 20 tablet 0    BP 123/72  Pulse 90  Temp 97.9 F (36.6 C) (Oral)  Resp 18  Ht 5\' 4"  (1.626 m)  Wt 152 lb (68.947 kg)  BMI 26.09 kg/m2  SpO2 99%  Physical Exam 0325: Physical examination:  Nursing notes reviewed; Vital signs and O2 SAT reviewed;  Constitutional: Well developed, Well nourished, Well hydrated, In no acute distress; Head:  Normocephalic, atraumatic; Eyes: EOMI, PERRL, No scleral icterus; ENMT: Mouth and pharynx normal, Mucous membranes moist; Neck: Supple, Full range of motion, No lymphadenopathy; Cardiovascular: Regular rate and rhythm, No murmur,  rub, or gallop; Respiratory: Breath sounds clear & equal bilaterally, No rales, rhonchi, wheezes.  Speaking full sentences with ease, Normal respiratory effort/excursion; Chest: Nontender, Movement normal; Abdomen: Soft, Nontender, Nondistended, Normal bowel sounds; Genitourinary: No CVA tenderness; Spine:  No midline CS, TS, LS tenderness.  +TTP right lumbar paraspinal muscles.;; Extremities: Pulses normal, No tenderness, No edema, No calf edema or asymmetry.; Neuro: AA&Ox3, Major CN grossly intact.  Speech clear. Strength 5/5 equal bilat UE's and LE's, including great toe dorsiflexion.  DTR 2/4 equal bilat UE's and LE's.  No gross sensory deficits.  Neg straight leg raises bilat. Gait steady;; Skin: Color normal, Warm, Dry.   ED Course  Procedures   MDM  MDM Reviewed: previous chart, nursing note and vitals     0325:  States she is driving and is aware that I cannot give her a narcotic pain medicine here in the ED.  Requests tylenol and motrin; will dose. Long hx of chronic pain dating back to before 2003 with multiple ED visits for same.  Former pt of [facility] Management [facility].  Pt endorses acute flair of her usual long standing chronic pain today, no change from her usual chronic pain pattern.  Pt encouraged to f/u with his PMD and Pain Management doctor for good continuity of care and control of her chronic pain.  Verb understanding.         Laray Anger, DO 07/17/12 1342

## 2012-07-16 NOTE — ED Notes (Signed)
Pt alert & oriented x4, stable gait. Patient given discharge instructions, paperwork & prescription(s). Patient  instructed to stop at the registration desk to finish any additional paperwork. Patient verbalized understanding. Pt left department w/ no further questions. 

## 2012-07-16 NOTE — ED Notes (Signed)
Pt reporting pain in lower back and into right hip.  Reports pain began about a week ago after unloading hay, but has been getting progressively worse.

## 2012-07-16 NOTE — ED Notes (Signed)
Pt reports back pain that radiates down the right leg. Pt states pain started hurting a week ago after helping move hay. States she has been taking ibuprofen w/ no relief.

## 2013-03-25 ENCOUNTER — Encounter (HOSPITAL_COMMUNITY): Payer: Self-pay | Admitting: *Deleted

## 2013-03-25 ENCOUNTER — Emergency Department (HOSPITAL_COMMUNITY)
Admission: EM | Admit: 2013-03-25 | Discharge: 2013-03-25 | Disposition: A | Discharge status: Home / Self Care | Payer: Medicare Other | Attending: Emergency Medicine | Admitting: Emergency Medicine

## 2013-03-25 DIAGNOSIS — Y929 Unspecified place or not applicable: Secondary | ICD-10-CM | POA: Insufficient documentation

## 2013-03-25 DIAGNOSIS — F17200 Nicotine dependence, unspecified, uncomplicated: Secondary | ICD-10-CM | POA: Insufficient documentation

## 2013-03-25 DIAGNOSIS — F329 Major depressive disorder, single episode, unspecified: Secondary | ICD-10-CM | POA: Insufficient documentation

## 2013-03-25 DIAGNOSIS — M5431 Sciatica, right side: Secondary | ICD-10-CM

## 2013-03-25 DIAGNOSIS — F3289 Other specified depressive episodes: Secondary | ICD-10-CM | POA: Insufficient documentation

## 2013-03-25 DIAGNOSIS — Z8742 Personal history of other diseases of the female genital tract: Secondary | ICD-10-CM | POA: Insufficient documentation

## 2013-03-25 DIAGNOSIS — X500XXA Overexertion from strenuous movement or load, initial encounter: Secondary | ICD-10-CM | POA: Insufficient documentation

## 2013-03-25 DIAGNOSIS — G43909 Migraine, unspecified, not intractable, without status migrainosus: Secondary | ICD-10-CM | POA: Insufficient documentation

## 2013-03-25 DIAGNOSIS — M5430 Sciatica, unspecified side: Secondary | ICD-10-CM | POA: Insufficient documentation

## 2013-03-25 DIAGNOSIS — Z8739 Personal history of other diseases of the musculoskeletal system and connective tissue: Secondary | ICD-10-CM | POA: Insufficient documentation

## 2013-03-25 DIAGNOSIS — F411 Generalized anxiety disorder: Secondary | ICD-10-CM | POA: Insufficient documentation

## 2013-03-25 DIAGNOSIS — G40909 Epilepsy, unspecified, not intractable, without status epilepticus: Secondary | ICD-10-CM | POA: Insufficient documentation

## 2013-03-25 DIAGNOSIS — Y9389 Activity, other specified: Secondary | ICD-10-CM | POA: Insufficient documentation

## 2013-03-25 DIAGNOSIS — G8929 Other chronic pain: Secondary | ICD-10-CM | POA: Insufficient documentation

## 2013-03-25 DIAGNOSIS — Z79899 Other long term (current) drug therapy: Secondary | ICD-10-CM | POA: Insufficient documentation

## 2013-03-25 MED ORDER — OXYCODONE-ACETAMINOPHEN 5-325 MG PO TABS: 1.0000 | ORAL_TABLET | ORAL | Status: DC | PRN

## 2013-03-25 MED ORDER — PREDNISONE 10 MG PO TABS: 20.0000 mg | ORAL_TABLET | Freq: Two times a day (BID) | ORAL | Status: DC

## 2013-03-25 MED ORDER — CYCLOBENZAPRINE HCL 10 MG PO TABS: 10.0000 mg | ORAL_TABLET | Freq: Two times a day (BID) | ORAL | Status: DC | PRN

## 2013-03-25 NOTE — ED Provider Notes (Signed)
Medical screening examination/treatment/procedure(s) were performed by non-physician practitioner and as supervising physician I was immediately available for consultation/collaboration.   Kendrell Lottman, MD 03/25/13 2342 

## 2013-03-25 NOTE — ED Provider Notes (Signed)
History     CSN: [id number]  Arrival date & time 03/25/13  1701   First MD Initiated Contact with Patient 03/25/13 1712      Chief Complaint  Patient presents with  . Back Pain    (Consider location/radiation/quality/duration/timing/severity/associated sxs/prior treatment) Patient is a 42 y.o. female presenting with back pain. The history is provided by the patient.  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 Relieved by:  Nothing Worsened by:  Movement, standing and twisting Ineffective treatments:  Ibuprofen, lying down and heating pad Associated symptoms: leg pain   Associated symptoms: no bladder incontinence, no bowel incontinence, no dysuria and no fever    Katie Brady is a 42 y.o. female who presents to the ED with low back pain.  Past Medical History  Diagnosis Date  . Migraine   . Depression   . Seizures   . Chronic back pain   . Sciatica     right  . Arthritis   . Ovarian cyst     Past Surgical History  Procedure Laterality Date  . Abdominal hysterectomy    . Bladder surgery    . Cholecystectomy      No family history on file.  History  Substance Use Topics  . Smoking status: Current Every Day Smoker -- 0.50 packs/day  . Smokeless tobacco: Not on file  . Alcohol Use: Yes     Comment: occasionally    OB History   Grav Para Term Preterm Abortions TAB SAB Ect Mult Living                  Review of Systems  Constitutional: Negative for fever and chills.  HENT: Positive for neck pain (chronic, no different than usual).   Gastrointestinal: Negative for nausea, vomiting and bowel incontinence.  Genitourinary: Negative for bladder incontinence, dysuria, urgency and frequency.    Musculoskeletal: Positive for back pain.  Skin: Negative for rash.  Neurological: Negative for seizures, speech difficulty and light-headedness.  Psychiatric/Behavioral: The patient is nervous/anxious (taking medication).     Allergies  Darvocet and Lodine  Home Medications   Current Outpatient Rx  Name  Route  Sig  Dispense  Refill  . alprazolam (XANAX) 2 MG tablet   Oral   Take 2 mg by mouth 3 (three) times daily.         Marland Kitchen amphetamine-dextroamphetamine (ADDERALL) 30 MG tablet   Oral   Take 30 mg by mouth daily.           Marland Kitchen escitalopram (LEXAPRO) 20 MG tablet   Oral   Take 20 mg by mouth daily.         . Estrogens Conjugated (PREMARIN PO)   Oral   Take 1 tablet by mouth daily.          Marland Kitchen gabapentin (NEURONTIN) 800 MG tablet   Oral   Take 3,200 mg by mouth daily.          Marland Kitchen levETIRAcetam (KEPPRA) 1000 MG tablet   Oral   Take 1,000 mg by mouth daily.           Marland Kitchen oxyCODONE-acetaminophen (PERCOCET/ROXICET) 5-325 MG per tablet   Oral   Take 1-2 tablets by  mouth every 4 (four) hours as needed for pain. 1 or 2 tabs PO q6h prn pain           BP 119/61  Pulse 98  Temp(Src) 97.7 F (36.5 C) (Oral)  Resp 18  Ht 5\' 4"  (1.626 m)  Wt 158 lb (71.668 kg)  BMI 27.11 kg/m2  SpO2 100%  Physical Exam  Nursing note and vitals reviewed. Constitutional: She is oriented to person, place, and time. She appears well-developed and well-nourished.  HENT:  Head: Normocephalic and atraumatic.  Eyes: EOM are normal.  Neck: Normal range of motion. Neck supple.  Cardiovascular: Normal rate, regular rhythm and normal heart sounds.   Pulmonary/Chest: Effort normal and breath sounds normal.  Abdominal: Soft. Bowel sounds are normal. There is no tenderness.  Musculoskeletal:       Lumbar back: She exhibits decreased range of motion, tenderness and spasm. She exhibits no deformity, no laceration and normal pulse.       Back:  Pain with palpation right lower lumbar area.  Pain over right sciatic nerve. Pedal pulses present and equal bilateral. Adequate circulation, good touch sensation.  Neurological: She is alert and oriented to person, place, and time. She has normal strength and normal reflexes. No cranial nerve deficit or sensory deficit. She displays a negative Romberg sign. Coordination and gait normal.  Skin: Skin is warm and dry.  Psychiatric: She has a normal mood and affect.    ED Course  Procedures (including critical care time)  MDM  42 y.o. female with low back pain. Sciatica. Will treat pain and patient is encouraged to follow up with a primary care doctor. She voices understanding. The patient is stable for discharge home without any immediate complications.    Medication List    TAKE these medications       cyclobenzaprine 10 MG tablet  Commonly known as:  FLEXERIL  Take 1 tablet (10 mg total) by mouth 2 (two) times daily as needed for muscle spasms.     oxyCODONE-acetaminophen 5-325 MG per tablet  Commonly known as:  ROXICET  Take 1 tablet by mouth every 4 (four) hours as needed for pain.     predniSONE 10 MG tablet  Commonly known as:  DELTASONE  Take 2 tablets (20 mg total) by mouth 2 (two) times daily.      ASK your doctor about these medications       alprazolam 2 MG tablet  Commonly known as:  XANAX  Take 2 mg by mouth 3 (three) times daily.     amphetamine-dextroamphetamine 30 MG tablet  Commonly known as:  ADDERALL  Take 30 mg by mouth daily.     escitalopram 20 MG tablet  Commonly known as:  LEXAPRO  Take 20 mg by mouth daily.     gabapentin 800 MG tablet  Commonly known as:  NEURONTIN  Take 3,200 mg by mouth daily.     levETIRAcetam 1000 MG tablet  Commonly known as:  KEPPRA  Take 1,000 mg by mouth daily.     oxyCODONE-acetaminophen 5-325 MG per tablet  Commonly known as:  PERCOCET/ROXICET  Take 1-2 tablets by mouth every 4 (four) hours as needed for pain. 1 or 2 tabs PO q6h prn pain     PREMARIN PO    Take 1 tablet by mouth daily.               Janne Napoleon, [place] 03/25/13 [phone]

## 2013-03-25 NOTE — ED Notes (Signed)
Pt c/o lower back pain that radiates down right leg, has hx of Degenerative disc disease and sciatica. Denies any new injury.

## 2013-05-23 ENCOUNTER — Other Ambulatory Visit (HOSPITAL_COMMUNITY): Payer: Self-pay | Admitting: Family Medicine

## 2013-05-23 DIAGNOSIS — Z139 Encounter for screening, unspecified: Secondary | ICD-10-CM

## 2013-05-26 ENCOUNTER — Ambulatory Visit (HOSPITAL_COMMUNITY): Payer: Medicare Other

## 2013-05-26 ENCOUNTER — Encounter (HOSPITAL_COMMUNITY): Payer: Self-pay | Admitting: *Deleted

## 2013-05-26 ENCOUNTER — Emergency Department (HOSPITAL_COMMUNITY)
Admission: EM | Admit: 2013-05-26 | Discharge: 2013-05-26 | Disposition: A | Discharge status: Home / Self Care | Payer: Medicare Other | Attending: Emergency Medicine | Admitting: Emergency Medicine

## 2013-05-26 ENCOUNTER — Ambulatory Visit (HOSPITAL_COMMUNITY)
Admission: RE | Admit: 2013-05-26 | Discharge: 2013-05-26 | Disposition: A | Discharge status: Home / Self Care | Payer: Medicare Other | Source: Ambulatory Visit | Attending: Family Medicine | Admitting: Family Medicine

## 2013-05-26 DIAGNOSIS — G8929 Other chronic pain: Secondary | ICD-10-CM | POA: Insufficient documentation

## 2013-05-26 DIAGNOSIS — F329 Major depressive disorder, single episode, unspecified: Secondary | ICD-10-CM | POA: Insufficient documentation

## 2013-05-26 DIAGNOSIS — Z8739 Personal history of other diseases of the musculoskeletal system and connective tissue: Secondary | ICD-10-CM | POA: Insufficient documentation

## 2013-05-26 DIAGNOSIS — Z79899 Other long term (current) drug therapy: Secondary | ICD-10-CM | POA: Insufficient documentation

## 2013-05-26 DIAGNOSIS — M25519 Pain in unspecified shoulder: Secondary | ICD-10-CM | POA: Insufficient documentation

## 2013-05-26 DIAGNOSIS — M129 Arthropathy, unspecified: Secondary | ICD-10-CM | POA: Insufficient documentation

## 2013-05-26 DIAGNOSIS — M549 Dorsalgia, unspecified: Secondary | ICD-10-CM | POA: Insufficient documentation

## 2013-05-26 DIAGNOSIS — G43909 Migraine, unspecified, not intractable, without status migrainosus: Secondary | ICD-10-CM | POA: Insufficient documentation

## 2013-05-26 DIAGNOSIS — Z8742 Personal history of other diseases of the female genital tract: Secondary | ICD-10-CM | POA: Insufficient documentation

## 2013-05-26 DIAGNOSIS — Z139 Encounter for screening, unspecified: Secondary | ICD-10-CM

## 2013-05-26 DIAGNOSIS — R569 Unspecified convulsions: Secondary | ICD-10-CM | POA: Insufficient documentation

## 2013-05-26 DIAGNOSIS — F17200 Nicotine dependence, unspecified, uncomplicated: Secondary | ICD-10-CM | POA: Insufficient documentation

## 2013-05-26 DIAGNOSIS — F3289 Other specified depressive episodes: Secondary | ICD-10-CM | POA: Insufficient documentation

## 2013-05-26 DIAGNOSIS — Z1231 Encounter for screening mammogram for malignant neoplasm of breast: Secondary | ICD-10-CM | POA: Insufficient documentation

## 2013-05-26 MED ORDER — OXYCODONE-ACETAMINOPHEN 5-325 MG PO TABS
2.0000 | ORAL_TABLET | Freq: Once | ORAL | Status: AC
  Administered 2013-05-26: 2 via ORAL
  Filled 2013-05-26: qty 2

## 2013-05-26 MED ORDER — OXYCODONE-ACETAMINOPHEN 5-325 MG PO TABS: 2.0000 | ORAL_TABLET | ORAL | Status: DC | PRN

## 2013-05-26 NOTE — ED Notes (Signed)
Chronic R neck, shoulder, back and leg pain x 1 year.  Has been using Oxycodone 10mg .  Dr. Janna Arch gave Rx for naprosyn which hasn't helped. Not part of pain clinic.

## 2013-05-26 NOTE — ED Provider Notes (Signed)
Medical screening examination/treatment/procedure(s) were performed by non-physician practitioner and as supervising physician I was immediately available for consultation/collaboration.   Janetta Vandoren, MD 05/26/13 1554 

## 2013-05-26 NOTE — ED Provider Notes (Signed)
CSN: [id number]     Arrival date & time 05/26/13  1259 History     First MD Initiated Contact with Patient 05/26/13 1329     Chief Complaint  Patient presents with  . Neck Pain  . Shoulder Pain  . Back Pain   (Consider location/radiation/quality/duration/timing/severity/associated sxs/prior Treatment) HPI Katie Brady is a 42 y.o. female who presents to the ED for pain management. Hx of right neck, shoulder, back and leg pain x 1 year. Has been taking Oxycodone 10 mg tabs when she was seeing one doctor but now referred to Dr. Janna Arch. He examined her, had blood work done and gave Rx for Naprosyn but is not helping. She has a follow up appointment with him on August 12th. She was here at the hospital today and had a mammogram and her pain was so bad she came to the ED for pain control. Her pain is in her shoulder, back and leg.   Past Medical History  Diagnosis Date  . Migraine   . Depression   . Seizures   . Chronic back pain   . Sciatica     right  . Arthritis   . Ovarian cyst    Past Surgical History  Procedure Laterality Date  . Abdominal hysterectomy    . Bladder surgery    . Cholecystectomy     History reviewed. No pertinent family history. History  Substance Use Topics  . Smoking status: Current Every Day Smoker -- 0.50 packs/day  . Smokeless tobacco: Not on file  . Alcohol Use: Yes     Comment: occasionally   OB History   Grav Para Term Preterm Abortions TAB SAB Ect Mult Living                 Review of Systems  Constitutional: Negative for fever and chills.  HENT: Positive for neck pain.   Respiratory: Negative for chest tightness and shortness of breath.   Cardiovascular: Negative for chest pain.  Gastrointestinal: Negative for nausea and vomiting.  Genitourinary: Negative for dysuria and frequency.  Musculoskeletal: Positive for back pain.       Shoulder pain  Skin: Negative for rash.  Neurological: Negative for headaches.  Psychiatric/Behavioral: The  patient is not nervous/anxious.     Allergies  Darvocet and Lodine  Home Medications   Current Outpatient Rx  Name  Route  Sig  Dispense  Refill  . alprazolam (XANAX) 2 MG tablet   Oral   Take 2 mg by mouth 3 (three) times daily.         Marland Kitchen amphetamine-dextroamphetamine (ADDERALL) 30 MG tablet   Oral   Take 30 mg by mouth daily.           . cyclobenzaprine (FLEXERIL) 10 MG tablet   Oral   Take 1 tablet (10 mg total) by mouth 2 (two) times daily as needed for muscle spasms.   20 tablet   0   . escitalopram (LEXAPRO) 20 MG tablet   Oral   Take 20 mg by mouth daily.         . Estrogens Conjugated (PREMARIN PO)   Oral   Take 1 tablet by mouth daily.          Marland Kitchen gabapentin (NEURONTIN) 800 MG tablet   Oral   Take 3,200 mg by mouth daily.          Marland Kitchen levETIRAcetam (KEPPRA) 1000 MG tablet   Oral   Take 1,000 mg by mouth daily.           Marland Kitchen  oxyCODONE-acetaminophen (PERCOCET/ROXICET) 5-325 MG per tablet   Oral   Take 1-2 tablets by mouth every 4 (four) hours as needed for pain. 1 or 2 tabs PO q6h prn pain         . oxyCODONE-acetaminophen (ROXICET) 5-325 MG per tablet   Oral   Take 1 tablet by mouth every 4 (four) hours as needed for pain.   15 tablet   0   . predniSONE (DELTASONE) 10 MG tablet   Oral   Take 2 tablets (20 mg total) by mouth 2 (two) times daily.   20 tablet   0    BP 122/72  Pulse 93  Temp(Src) 97.7 F (36.5 C)  Resp 18  Ht 5\' 3"  (1.6 m)  Wt 160 lb (72.576 kg)  BMI 28.35 kg/m2  SpO2 100% Physical Exam  Nursing note and vitals reviewed. Constitutional: She is oriented to person, place, and time. She appears well-developed and well-nourished. No distress.  HENT:  Head: Normocephalic.  Eyes: EOM are normal.  Neck: Neck supple.  Cardiovascular: Normal rate and regular rhythm.   Pulmonary/Chest: Effort normal and breath sounds normal.  Abdominal: Soft. Bowel sounds are normal. There is no tenderness.  Musculoskeletal:       Right  shoulder: She exhibits tenderness. She exhibits normal range of motion, no deformity, no spasm, normal pulse and normal strength.       Lumbar back: She exhibits decreased range of motion and tenderness. She exhibits no spasm.  Neurological: She is alert and oriented to person, place, and time. She has normal strength and normal reflexes. No cranial nerve deficit or sensory deficit.  Pedal pulses equal bilateral.  Skin: Skin is warm and dry.  Psychiatric: She has a normal mood and affect. Her behavior is normal.    ED Course   Procedures   MDM  42 y.o. female with chronic pain. Will treat pain until she can follow up with her doctor on Monday. Discussed use of ED for chronic pain.    Medication List    TAKE these medications       oxyCODONE-acetaminophen 5-325 MG per tablet  Commonly known as:  PERCOCET/ROXICET  Take 2 tablets by mouth every 4 (four) hours as needed for pain.      ASK your doctor about these medications       alprazolam 2 MG tablet  Commonly known as:  XANAX  Take 2 mg by mouth 3 (three) times daily.     amphetamine-dextroamphetamine 30 MG tablet  Commonly known as:  ADDERALL  Take 30 mg by mouth daily.     escitalopram 20 MG tablet  Commonly known as:  LEXAPRO  Take 20 mg by mouth daily.     gabapentin 800 MG tablet  Commonly known as:  NEURONTIN  Take 3,200 mg by mouth daily.     levETIRAcetam 1000 MG tablet  Commonly known as:  KEPPRA  Take 1,000 mg by mouth daily.     naproxen 500 MG tablet  Commonly known as:  NAPROSYN  Take 500 mg by mouth 2 (two) times daily with a meal.     PREMARIN PO  Take 1 tablet by mouth daily.         Janne Napoleon, [place] 05/26/13 1454

## 2013-06-07 ENCOUNTER — Other Ambulatory Visit (HOSPITAL_COMMUNITY): Payer: Self-pay | Admitting: Family Medicine

## 2013-06-07 DIAGNOSIS — M545 Low back pain: Secondary | ICD-10-CM

## 2013-06-09 ENCOUNTER — Ambulatory Visit (HOSPITAL_COMMUNITY)
Admission: RE | Admit: 2013-06-09 | Discharge: 2013-06-09 | Disposition: A | Discharge status: Home / Self Care | Payer: Medicare Other | Source: Ambulatory Visit | Attending: Family Medicine | Admitting: Family Medicine

## 2013-06-09 DIAGNOSIS — M47817 Spondylosis without myelopathy or radiculopathy, lumbosacral region: Secondary | ICD-10-CM | POA: Insufficient documentation

## 2013-06-09 DIAGNOSIS — M545 Low back pain, unspecified: Secondary | ICD-10-CM | POA: Insufficient documentation

## 2013-10-02 ENCOUNTER — Encounter (HOSPITAL_COMMUNITY): Payer: Self-pay | Admitting: Emergency Medicine

## 2013-10-02 ENCOUNTER — Emergency Department (HOSPITAL_COMMUNITY)
Admission: EM | Admit: 2013-10-02 | Discharge: 2013-10-02 | Disposition: A | Discharge status: Home / Self Care | Payer: Medicare Other | Attending: Emergency Medicine | Admitting: Emergency Medicine

## 2013-10-02 DIAGNOSIS — IMO0002 Reserved for concepts with insufficient information to code with codable children: Secondary | ICD-10-CM | POA: Insufficient documentation

## 2013-10-02 DIAGNOSIS — M5432 Sciatica, left side: Secondary | ICD-10-CM

## 2013-10-02 DIAGNOSIS — G43909 Migraine, unspecified, not intractable, without status migrainosus: Secondary | ICD-10-CM | POA: Insufficient documentation

## 2013-10-02 DIAGNOSIS — Y9389 Activity, other specified: Secondary | ICD-10-CM | POA: Insufficient documentation

## 2013-10-02 DIAGNOSIS — S335XXA Sprain of ligaments of lumbar spine, initial encounter: Secondary | ICD-10-CM | POA: Insufficient documentation

## 2013-10-02 DIAGNOSIS — G40909 Epilepsy, unspecified, not intractable, without status epilepticus: Secondary | ICD-10-CM | POA: Insufficient documentation

## 2013-10-02 DIAGNOSIS — Y929 Unspecified place or not applicable: Secondary | ICD-10-CM | POA: Insufficient documentation

## 2013-10-02 DIAGNOSIS — Z79899 Other long term (current) drug therapy: Secondary | ICD-10-CM | POA: Insufficient documentation

## 2013-10-02 DIAGNOSIS — T148XXA Other injury of unspecified body region, initial encounter: Secondary | ICD-10-CM

## 2013-10-02 DIAGNOSIS — M129 Arthropathy, unspecified: Secondary | ICD-10-CM | POA: Insufficient documentation

## 2013-10-02 DIAGNOSIS — F3289 Other specified depressive episodes: Secondary | ICD-10-CM | POA: Insufficient documentation

## 2013-10-02 DIAGNOSIS — F17200 Nicotine dependence, unspecified, uncomplicated: Secondary | ICD-10-CM | POA: Insufficient documentation

## 2013-10-02 DIAGNOSIS — M79609 Pain in unspecified limb: Secondary | ICD-10-CM | POA: Insufficient documentation

## 2013-10-02 DIAGNOSIS — X500XXA Overexertion from strenuous movement or load, initial encounter: Secondary | ICD-10-CM | POA: Insufficient documentation

## 2013-10-02 DIAGNOSIS — F329 Major depressive disorder, single episode, unspecified: Secondary | ICD-10-CM | POA: Insufficient documentation

## 2013-10-02 DIAGNOSIS — M5430 Sciatica, unspecified side: Secondary | ICD-10-CM | POA: Insufficient documentation

## 2013-10-02 DIAGNOSIS — G8929 Other chronic pain: Secondary | ICD-10-CM | POA: Insufficient documentation

## 2013-10-02 MED ORDER — DICLOFENAC SODIUM 75 MG PO TBEC: 75.0000 mg | DELAYED_RELEASE_TABLET | Freq: Two times a day (BID) | ORAL | Status: AC

## 2013-10-02 MED ORDER — DEXAMETHASONE 6 MG PO TABS: ORAL_TABLET | ORAL | Status: DC

## 2013-10-02 MED ORDER — METHOCARBAMOL 500 MG PO TABS: 500.0000 mg | ORAL_TABLET | Freq: Three times a day (TID) | ORAL | Status: DC

## 2013-10-02 NOTE — ED Notes (Signed)
Pt reports pain generalized on the back, starts from neck radiates to upper and lower back, buttocks to her legs. Reports being treated for sciatic nerve problem previously.

## 2013-10-02 NOTE — ED Provider Notes (Signed)
CSN: [id number]     Arrival date & time 10/02/13  0920 History   First MD Initiated Contact with Patient 10/02/13 [phone]     Chief Complaint  Patient presents with  . Back Pain   (Consider location/radiation/quality/duration/timing/severity/associated sxs/prior Treatment) Patient is a 42 y.o. female presenting with back pain. The history is provided by the patient.  Back Pain Location:  Lumbar spine Quality:  Aching Pain severity:  Moderate Pain is:  Same all the time Duration: back pain for 4 years. worse 4 days ago. Timing:  Intermittent Progression:  Worsening Chronicity: acute on chronic. Context: lifting heavy objects   Relieved by:  Nothing Worsened by:  Movement Associated symptoms: headaches and leg pain   Associated symptoms: no abdominal pain, no bladder incontinence, no bowel incontinence, no chest pain, no dysuria, no fever, no numbness and no perianal numbness     Past Medical History  Diagnosis Date  . Migraine   . Depression   . Seizures   . Chronic back pain   . Sciatica     right  . Arthritis   . Ovarian cyst    Past Surgical History  Procedure Laterality Date  . Abdominal hysterectomy    . Bladder surgery    . Cholecystectomy     History reviewed. No pertinent family history. History  Substance Use Topics  . Smoking status: Current Every Day Smoker -- 0.50 packs/day  . Smokeless tobacco: Not on file  . Alcohol Use: No     Comment: occasionally   OB History   Grav Para Term Preterm Abortions TAB SAB Ect Mult Living                 Review of Systems  Constitutional: Negative for fever and activity change.       All ROS Neg except as noted in HPI  HENT: Negative for nosebleeds.   Eyes: Negative for photophobia and discharge.  Respiratory: Negative for cough, shortness of breath and wheezing.   Cardiovascular: Negative for chest pain and palpitations.  Gastrointestinal: Negative for abdominal pain, blood in stool and bowel incontinence.   Genitourinary: Negative for bladder incontinence, dysuria, frequency and hematuria.  Musculoskeletal: Positive for arthralgias and back pain. Negative for neck pain.  Skin: Negative.   Neurological: Positive for seizures and headaches. Negative for dizziness, speech difficulty and numbness.  Psychiatric/Behavioral: Negative for hallucinations and confusion.       Depression    Allergies  Darvocet and Lodine  Home Medications   Current Outpatient Rx  Name  Route  Sig  Dispense  Refill  . amphetamine-dextroamphetamine (ADDERALL) 10 MG tablet   Oral   Take 10 mg by mouth 2 (two) times daily with a meal.         . clonazePAM (KLONOPIN) 0.5 MG tablet   Oral   Take 0.5 mg by mouth daily.         Marland Kitchen escitalopram (LEXAPRO) 20 MG tablet   Oral   Take 20 mg by mouth daily.         . Estrogens Conjugated (PREMARIN PO)   Oral   Take 1 tablet by mouth daily.          Marland Kitchen gabapentin (NEURONTIN) 800 MG tablet   Oral   Take 800 mg by mouth 4 (four) times daily.          Marland Kitchen levETIRAcetam (KEPPRA) 500 MG tablet   Oral   Take 500 mg by mouth 2 (two) times daily.         Marland Kitchen  naproxen (NAPROSYN) 500 MG tablet   Oral   Take 500 mg by mouth 3 (three) times daily with meals.           BP 124/84  Pulse 114  Temp(Src) 97.7 F (36.5 C) (Oral)  Resp 18  Ht 5\' 4"  (1.626 m)  Wt 170 lb (77.111 kg)  BMI 29.17 kg/m2  SpO2 99% Physical Exam  Nursing note and vitals reviewed. Constitutional: She is oriented to person, place, and time. She appears well-developed and well-nourished.  Non-toxic appearance.  HENT:  Head: Normocephalic.  Right Ear: Tympanic membrane and external ear normal.  Left Ear: Tympanic membrane and external ear normal.  Eyes: EOM and lids are normal. Pupils are equal, round, and reactive to light.  Neck: Normal range of motion. Neck supple. Carotid bruit is not present.  Cardiovascular: Normal rate, regular rhythm, normal heart sounds, intact distal pulses and  normal pulses.   Pulmonary/Chest: Breath sounds normal. No respiratory distress.  Abdominal: Soft. Bowel sounds are normal. There is no tenderness. There is no guarding.  Musculoskeletal: Normal range of motion.  Patient has pain and some spasm below the scapula on the right. There is spasm at the lumbar paraspinal area. There is mild to moderate pain to palpation of the midline of the lumbar region. There is pain with straight leg raise at 20 on the right.  Lymphadenopathy:       Head (right side): No submandibular adenopathy present.       Head (left side): No submandibular adenopathy present.    She has no cervical adenopathy.  Neurological: She is alert and oriented to person, place, and time. She has normal strength. No cranial nerve deficit or sensory deficit. She exhibits normal muscle tone. Coordination normal.  Skin: Skin is warm and dry.  Psychiatric: She has a normal mood and affect. Her speech is normal.    ED Course  Procedures (including critical care time) Labs Review Labs Reviewed - No data to display Imaging Review No results found.  EKG Interpretation   None       MDM  No diagnosis found. *I have reviewed nursing notes, vital signs, and all appropriate lab and imaging results for this patient.**  Patient has history of chronic back pain and sciatica. She reinjured her back lifting a couch. There's been no loss of bowel or bladder function. There's been no fall. And the patient has no evidence of foot drop on examination or by history.  Prescription for Decadron, diclofenac, and Robaxin given to the patient. Patient is to rest her back, and use heat to the lower back. Patient is to see Dr. Delbert Harness for evaluation in the office and management.  Kathie Dike, PA-C 10/02/13 1017

## 2013-10-11 NOTE — ED Provider Notes (Signed)
Medical screening examination/treatment/procedure(s) were performed by non-physician practitioner and as supervising physician I was immediately available for consultation/collaboration.  EKG Interpretation   None         Giacomo Valone W. Lamontae Ricardo, MD 10/11/13 0046 

## 2014-06-07 ENCOUNTER — Other Ambulatory Visit (HOSPITAL_COMMUNITY): Payer: Self-pay | Admitting: Family Medicine

## 2014-06-07 DIAGNOSIS — R52 Pain, unspecified: Secondary | ICD-10-CM

## 2014-06-08 ENCOUNTER — Ambulatory Visit (HOSPITAL_COMMUNITY)
Admission: RE | Admit: 2014-06-08 | Discharge: 2014-06-08 | Disposition: A | Discharge status: Home / Self Care | Payer: Medicare Other | Source: Ambulatory Visit | Attending: Family Medicine | Admitting: Family Medicine

## 2014-06-08 DIAGNOSIS — M5020 Other cervical disc displacement, unspecified cervical region: Secondary | ICD-10-CM | POA: Insufficient documentation

## 2014-06-08 DIAGNOSIS — M542 Cervicalgia: Secondary | ICD-10-CM | POA: Diagnosis present

## 2014-06-08 DIAGNOSIS — R52 Pain, unspecified: Secondary | ICD-10-CM | POA: Diagnosis not present

## 2014-12-24 ENCOUNTER — Emergency Department (HOSPITAL_COMMUNITY)
Admission: EM | Admit: 2014-12-24 | Discharge: 2014-12-25 | Disposition: A | Discharge status: Home / Self Care | Payer: Medicare Other | Attending: Emergency Medicine | Admitting: Emergency Medicine

## 2014-12-24 ENCOUNTER — Encounter (HOSPITAL_COMMUNITY): Payer: Self-pay | Admitting: *Deleted

## 2014-12-24 ENCOUNTER — Emergency Department (HOSPITAL_COMMUNITY): Payer: Medicare Other

## 2014-12-24 DIAGNOSIS — R059 Cough, unspecified: Secondary | ICD-10-CM

## 2014-12-24 DIAGNOSIS — Z720 Tobacco use: Secondary | ICD-10-CM | POA: Diagnosis not present

## 2014-12-24 DIAGNOSIS — R0789 Other chest pain: Secondary | ICD-10-CM | POA: Diagnosis not present

## 2014-12-24 DIAGNOSIS — Z79899 Other long term (current) drug therapy: Secondary | ICD-10-CM | POA: Diagnosis not present

## 2014-12-24 DIAGNOSIS — K529 Noninfective gastroenteritis and colitis, unspecified: Secondary | ICD-10-CM | POA: Insufficient documentation

## 2014-12-24 DIAGNOSIS — Z90710 Acquired absence of both cervix and uterus: Secondary | ICD-10-CM | POA: Diagnosis not present

## 2014-12-24 DIAGNOSIS — M1990 Unspecified osteoarthritis, unspecified site: Secondary | ICD-10-CM | POA: Insufficient documentation

## 2014-12-24 DIAGNOSIS — Z9049 Acquired absence of other specified parts of digestive tract: Secondary | ICD-10-CM | POA: Insufficient documentation

## 2014-12-24 DIAGNOSIS — G8929 Other chronic pain: Secondary | ICD-10-CM | POA: Diagnosis not present

## 2014-12-24 DIAGNOSIS — R0981 Nasal congestion: Secondary | ICD-10-CM | POA: Insufficient documentation

## 2014-12-24 DIAGNOSIS — G40909 Epilepsy, unspecified, not intractable, without status epilepticus: Secondary | ICD-10-CM | POA: Diagnosis not present

## 2014-12-24 DIAGNOSIS — J3489 Other specified disorders of nose and nasal sinuses: Secondary | ICD-10-CM | POA: Insufficient documentation

## 2014-12-24 DIAGNOSIS — Z791 Long term (current) use of non-steroidal anti-inflammatories (NSAID): Secondary | ICD-10-CM | POA: Insufficient documentation

## 2014-12-24 DIAGNOSIS — R509 Fever, unspecified: Secondary | ICD-10-CM | POA: Diagnosis present

## 2014-12-24 DIAGNOSIS — F329 Major depressive disorder, single episode, unspecified: Secondary | ICD-10-CM | POA: Insufficient documentation

## 2014-12-24 DIAGNOSIS — G43909 Migraine, unspecified, not intractable, without status migrainosus: Secondary | ICD-10-CM | POA: Insufficient documentation

## 2014-12-24 DIAGNOSIS — R05 Cough: Secondary | ICD-10-CM | POA: Diagnosis not present

## 2014-12-24 DIAGNOSIS — Z8742 Personal history of other diseases of the female genital tract: Secondary | ICD-10-CM | POA: Insufficient documentation

## 2014-12-24 DIAGNOSIS — R0602 Shortness of breath: Secondary | ICD-10-CM | POA: Diagnosis not present

## 2014-12-24 DIAGNOSIS — E876 Hypokalemia: Secondary | ICD-10-CM | POA: Insufficient documentation

## 2014-12-24 LAB — COMPREHENSIVE METABOLIC PANEL
ALK PHOS: 55 U/L (ref 39–117)
ALT: 20 U/L (ref 0–35)
AST: 19 U/L (ref 0–37)
Albumin: 3.5 g/dL (ref 3.5–5.2)
Anion gap: 3 — ABNORMAL LOW (ref 5–15)
BUN: 14 mg/dL (ref 6–23)
CO2: 25 mmol/L (ref 19–32)
Calcium: 7.7 mg/dL — ABNORMAL LOW (ref 8.4–10.5)
Chloride: 112 mmol/L (ref 96–112)
Creatinine, Ser: 0.61 mg/dL (ref 0.50–1.10)
Glucose, Bld: 110 mg/dL — ABNORMAL HIGH (ref 70–99)
POTASSIUM: 3 mmol/L — AB (ref 3.5–5.1)
Sodium: 140 mmol/L (ref 135–145)
Total Bilirubin: 0.2 mg/dL — ABNORMAL LOW (ref 0.3–1.2)
Total Protein: 6.5 g/dL (ref 6.0–8.3)

## 2014-12-24 LAB — CBC WITH DIFFERENTIAL/PLATELET
Basophils Absolute: 0 10*3/uL (ref 0.0–0.1)
Basophils Relative: 1 % (ref 0–1)
EOS PCT: 0 % (ref 0–5)
Eosinophils Absolute: 0 10*3/uL (ref 0.0–0.7)
HEMATOCRIT: 31 % — AB (ref 36.0–46.0)
HEMOGLOBIN: 10.5 g/dL — AB (ref 12.0–15.0)
Lymphocytes Relative: 31 % (ref 12–46)
Lymphs Abs: 0.8 10*3/uL (ref 0.7–4.0)
MCH: 30.8 pg (ref 26.0–34.0)
MCHC: 33.9 g/dL (ref 30.0–36.0)
MCV: 90.9 fL (ref 78.0–100.0)
MONO ABS: 0.3 10*3/uL (ref 0.1–1.0)
MONOS PCT: 13 % — AB (ref 3–12)
NEUTROS ABS: 1.4 10*3/uL — AB (ref 1.7–7.7)
Neutrophils Relative %: 55 % (ref 43–77)
Platelets: 137 10*3/uL — ABNORMAL LOW (ref 150–400)
RBC: 3.41 MIL/uL — ABNORMAL LOW (ref 3.87–5.11)
RDW: 13.3 % (ref 11.5–15.5)
WBC: 2.5 10*3/uL — ABNORMAL LOW (ref 4.0–10.5)

## 2014-12-24 LAB — URINE MICROSCOPIC-ADD ON

## 2014-12-24 LAB — URINALYSIS, ROUTINE W REFLEX MICROSCOPIC
BILIRUBIN URINE: NEGATIVE
Glucose, UA: NEGATIVE mg/dL
Ketones, ur: NEGATIVE mg/dL
Leukocytes, UA: NEGATIVE
Nitrite: NEGATIVE
PROTEIN: NEGATIVE mg/dL
UROBILINOGEN UA: 0.2 mg/dL (ref 0.0–1.0)
pH: 6 (ref 5.0–8.0)

## 2014-12-24 LAB — LIPASE, BLOOD: LIPASE: 27 U/L (ref 11–59)

## 2014-12-24 MED ORDER — POTASSIUM CHLORIDE ER 10 MEQ PO TBCR: 10.0000 meq | EXTENDED_RELEASE_TABLET | Freq: Two times a day (BID) | ORAL | Status: AC

## 2014-12-24 MED ORDER — POTASSIUM CHLORIDE 10 MEQ/100ML IV SOLN
INTRAVENOUS | Status: AC
  Filled 2014-12-24: qty 100

## 2014-12-24 MED ORDER — SODIUM CHLORIDE 0.9 % IV SOLN
INTRAVENOUS | Status: DC
  Administered 2014-12-25: via INTRAVENOUS

## 2014-12-24 MED ORDER — HYDROMORPHONE HCL 1 MG/ML IJ SOLN
1.0000 mg | Freq: Once | INTRAMUSCULAR | Status: AC
  Administered 2014-12-24: 1 mg via INTRAVENOUS
  Filled 2014-12-24: qty 1

## 2014-12-24 MED ORDER — POTASSIUM CHLORIDE 10 MEQ/100ML IV SOLN
10.0000 meq | Freq: Once | INTRAVENOUS | Status: AC
  Administered 2014-12-24: 10 meq via INTRAVENOUS

## 2014-12-24 MED ORDER — POTASSIUM CHLORIDE CRYS ER 20 MEQ PO TBCR
40.0000 meq | EXTENDED_RELEASE_TABLET | Freq: Once | ORAL | Status: AC
  Administered 2014-12-24: 40 meq via ORAL
  Filled 2014-12-24: qty 2

## 2014-12-24 MED ORDER — ONDANSETRON 4 MG PO TBDP: 4.0000 mg | ORAL_TABLET | Freq: Three times a day (TID) | ORAL | Status: AC | PRN

## 2014-12-24 MED ORDER — HYDROCODONE-ACETAMINOPHEN 5-325 MG PO TABS: 1.0000 | ORAL_TABLET | Freq: Four times a day (QID) | ORAL | Status: AC | PRN

## 2014-12-24 MED ORDER — SODIUM CHLORIDE 0.9 % IV BOLUS (SEPSIS)
1000.0000 mL | Freq: Once | INTRAVENOUS | Status: AC
  Administered 2014-12-24: 1000 mL via INTRAVENOUS

## 2014-12-24 MED ORDER — ONDANSETRON HCL 4 MG/2ML IJ SOLN
4.0000 mg | Freq: Once | INTRAMUSCULAR | Status: AC
  Administered 2014-12-24: 4 mg via INTRAVENOUS
  Filled 2014-12-24: qty 2

## 2014-12-24 MED ORDER — SODIUM CHLORIDE 0.9 % IV BOLUS (SEPSIS): 1000.0000 mL | Freq: Once | INTRAVENOUS | Status: DC

## 2014-12-24 MED ORDER — LOPERAMIDE HCL 2 MG PO TABS: 2.0000 mg | ORAL_TABLET | Freq: Four times a day (QID) | ORAL | Status: AC | PRN

## 2014-12-24 NOTE — Discharge Instructions (Signed)
Take medications as directed. Follow-up with your doctor in the next few days to potassium rechecked. If not improved in 2 days return. Return for new or worse symptoms. Recommend clear liquid diet and advance to bland diet as tolerated.

## 2014-12-24 NOTE — ED Notes (Signed)
Body aches, nausea, diarrhea chest congestion , yellow sputum. Fever.  Taking a Z pak.

## 2014-12-24 NOTE — ED Provider Notes (Signed)
CSN: [id number]     Arrival date & time 12/24/14  2123 History  This chart was scribed for Fredia Sorrow, MD by Dellis Filbert, ED Scribe. The patient was seen in [facility] and the patient's care was started at 10:03 PM.   No chief complaint on file.   Patient is a 44 y.o. female presenting with fever. The history is provided by the patient. No language interpreter was used.  Fever Temp source:  Subjective Onset quality:  Sudden Duration:  7 days Timing:  Constant Chronicity:  New Associated symptoms: congestion, cough, diarrhea, headaches, myalgias, nausea and vomiting   Associated symptoms: no chills, no dysuria and no rash     HPI Comments: Katie Brady is a 44 y.o. female who presents to the Emergency Department complaining of flu like symptoms onset 1 week ago. She complains of a fever, nausea, diarrhea, emesis, generalized body aches, productive cough, sinus and chest congestion. Pt was started on a z pak 3 days ago for no relief. She has had sick contacts.  Past Medical History  Diagnosis Date  . Migraine   . Depression   . Seizures   . Chronic back pain   . Sciatica     right  . Arthritis   . Ovarian cyst    Past Surgical History  Procedure Laterality Date  . Abdominal hysterectomy    . Bladder surgery    . Cholecystectomy     History reviewed. No pertinent family history. History  Substance Use Topics  . Smoking status: Current Every Day Smoker -- 0.50 packs/day    Types: Cigarettes  . Smokeless tobacco: Not on file  . Alcohol Use: No     Comment: occasionally   OB History    No data available     Review of Systems  Constitutional: Positive for fever. Negative for chills.  HENT: Positive for congestion and sinus pressure.   Eyes: Negative for visual disturbance.  Respiratory: Positive for cough, chest tightness and shortness of breath.   Cardiovascular: Negative for leg swelling.  Gastrointestinal: Positive for nausea, vomiting, abdominal pain and  diarrhea.  Genitourinary: Negative for dysuria.  Musculoskeletal: Positive for myalgias.  Skin: Negative for rash.  Neurological: Positive for headaches.  Hematological: Does not bruise/bleed easily.   Allergies  Darvocet and Lodine  Home Medications   Prior to Admission medications   Medication Sig Start Date End Date Taking? Authorizing Provider  amphetamine-dextroamphetamine (ADDERALL) 10 MG tablet Take 10 mg by mouth 3 (three) times daily.    Yes Historical Provider, MD  azithromycin (ZITHROMAX) 250 MG tablet Take 250-500 mg by mouth See admin instructions. TWO TABLETS ON DAY 1 (12/21/2014) THEN TAKE ONE TABLET ON DAYS 2 THROUGH 5   Yes Historical Provider, MD  clonazePAM (KLONOPIN) 0.5 MG tablet Take 0.5 mg by mouth 2 (two) times daily as needed for anxiety.    Yes Historical Provider, MD  omeprazole (PRILOSEC) 20 MG capsule Take 20 mg by mouth daily.   Yes Historical Provider, MD  Phenylephrine-DM-GG 2.5-5-100 MG/5ML LIQD Take 10 mLs by mouth daily as needed (FOR CONGESTION).   Yes Historical Provider, MD  dexamethasone (DECADRON) 6 MG tablet 1 po bid with food Patient not taking: Reported on 12/24/2014 10/02/13   Lenox Ahr, PA-C  diclofenac (VOLTAREN) 75 MG EC tablet Take 1 tablet (75 mg total) by mouth 2 (two) times daily. Patient not taking: Reported on 12/24/2014 10/02/13   Lenox Ahr, PA-C  HYDROcodone-acetaminophen (NORCO/VICODIN) 5-325 MG per  tablet Take 1-2 tablets by mouth every 6 (six) hours as needed. 12/24/14   Fredia Sorrow, MD  loperamide (IMODIUM A-D) 2 MG tablet Take 1 tablet (2 mg total) by mouth 4 (four) times daily as needed for diarrhea or loose stools. 12/24/14   Fredia Sorrow, MD  ondansetron (ZOFRAN ODT) 4 MG disintegrating tablet Take 1 tablet (4 mg total) by mouth every 8 (eight) hours as needed. 12/24/14   Fredia Sorrow, MD  potassium chloride (K-DUR) 10 MEQ tablet Take 1 tablet (10 mEq total) by mouth 2 (two) times daily. 12/24/14   Fredia Sorrow,  MD   BP 133/70 mmHg  Pulse 96  Temp(Src) 98.1 F (36.7 C) (Oral)  Resp 24  Ht 5\' 4"  (1.626 m)  Wt 180 lb (81.647 kg)  BMI 30.88 kg/m2  SpO2 99% Physical Exam  Constitutional: She is oriented to person, place, and time. She appears well-developed and well-nourished.  HENT:  Head: Normocephalic and atraumatic.  Mouth/Throat: Oropharynx is clear and moist. Mucous membranes are dry.  Eyes: Conjunctivae and EOM are normal. Pupils are equal, round, and reactive to light. No scleral icterus.  Cardiovascular: Normal rate, regular rhythm and normal heart sounds.   No murmur heard. Pulmonary/Chest: Effort normal and breath sounds normal. She has no wheezes. She has no rales.  Lungs sound clear bilaterally.  Abdominal: Soft. Bowel sounds are increased. There is no tenderness.  Musculoskeletal: She exhibits no edema.  Neurological: She is alert and oriented to person, place, and time. She has normal reflexes. No cranial nerve deficit. She exhibits normal muscle tone. Coordination normal.  Skin: Skin is warm.  Nursing note reviewed.   ED Course  Procedures  DIAGNOSTIC STUDIES: Oxygen Saturation is 99% on room air, normal by my interpretation.    COORDINATION OF CARE: 10:07 PM Discussed treatment plan with pt at bedside and pt agreed to plan.   Labs Review Labs Reviewed  CBC WITH DIFFERENTIAL/PLATELET - Abnormal; Notable for the following:    WBC 2.5 (*)    RBC 3.41 (*)    Hemoglobin 10.5 (*)    HCT 31.0 (*)    Platelets 137 (*)    Neutro Abs 1.4 (*)    Monocytes Relative 13 (*)    All other components within normal limits  COMPREHENSIVE METABOLIC PANEL - Abnormal; Notable for the following:    Potassium 3.0 (*)    Glucose, Bld 110 (*)    Calcium 7.7 (*)    Total Bilirubin 0.2 (*)    Anion gap 3 (*)    All other components within normal limits  URINALYSIS, ROUTINE W REFLEX MICROSCOPIC - Abnormal; Notable for the following:    Specific Gravity, Urine >1.030 (*)    Hgb urine  dipstick TRACE (*)    All other components within normal limits  URINE MICROSCOPIC-ADD ON - Abnormal; Notable for the following:    Squamous Epithelial / LPF FEW (*)    Bacteria, UA FEW (*)    All other components within normal limits  LIPASE, BLOOD   Results for orders placed or performed during the hospital encounter of 12/24/14  CBC with Differential/Platelet  Result Value Ref Range   WBC 2.5 (L) 4.0 - 10.5 K/uL   RBC 3.41 (L) 3.87 - 5.11 MIL/uL   Hemoglobin 10.5 (L) 12.0 - 15.0 g/dL   HCT 31.0 (L) 36.0 - 46.0 %   MCV 90.9 78.0 - 100.0 fL   MCH 30.8 26.0 - 34.0 pg   MCHC 33.9 30.0 -  36.0 g/dL   RDW 13.3 11.5 - 15.5 %   Platelets 137 (L) 150 - 400 K/uL   Neutrophils Relative % 55 43 - 77 %   Neutro Abs 1.4 (L) 1.7 - 7.7 K/uL   Lymphocytes Relative 31 12 - 46 %   Lymphs Abs 0.8 0.7 - 4.0 K/uL   Monocytes Relative 13 (H) 3 - 12 %   Monocytes Absolute 0.3 0.1 - 1.0 K/uL   Eosinophils Relative 0 0 - 5 %   Eosinophils Absolute 0.0 0.0 - 0.7 K/uL   Basophils Relative 1 0 - 1 %   Basophils Absolute 0.0 0.0 - 0.1 K/uL  Comprehensive metabolic panel  Result Value Ref Range   Sodium 140 135 - 145 mmol/L   Potassium 3.0 (L) 3.5 - 5.1 mmol/L   Chloride 112 96 - 112 mmol/L   CO2 25 19 - 32 mmol/L   Glucose, Bld 110 (H) 70 - 99 mg/dL   BUN 14 6 - 23 mg/dL   Creatinine, Ser 0.61 0.50 - 1.10 mg/dL   Calcium 7.7 (L) 8.4 - 10.5 mg/dL   Total Protein 6.5 6.0 - 8.3 g/dL   Albumin 3.5 3.5 - 5.2 g/dL   AST 19 0 - 37 U/L   ALT 20 0 - 35 U/L   Alkaline Phosphatase 55 39 - 117 U/L   Total Bilirubin 0.2 (L) 0.3 - 1.2 mg/dL   GFR calc non Af Amer >90 >90 mL/min   GFR calc Af Amer >90 >90 mL/min   Anion gap 3 (L) 5 - 15  Lipase, blood  Result Value Ref Range   Lipase 27 11 - 59 U/L  Urinalysis, Routine w reflex microscopic  Result Value Ref Range   Color, Urine YELLOW YELLOW   APPearance CLEAR CLEAR   Specific Gravity, Urine >1.030 (H) 1.005 - 1.030   pH 6.0 5.0 - 8.0   Glucose, UA  NEGATIVE NEGATIVE mg/dL   Hgb urine dipstick TRACE (A) NEGATIVE   Bilirubin Urine NEGATIVE NEGATIVE   Ketones, ur NEGATIVE NEGATIVE mg/dL   Protein, ur NEGATIVE NEGATIVE mg/dL   Urobilinogen, UA 0.2 0.0 - 1.0 mg/dL   Nitrite NEGATIVE NEGATIVE   Leukocytes, UA NEGATIVE NEGATIVE  Urine microscopic-add on  Result Value Ref Range   Squamous Epithelial / LPF FEW (A) RARE   WBC, UA 0-2 <3 WBC/hpf   RBC / HPF 0-2 <3 RBC/hpf   Bacteria, UA FEW (A) RARE     Imaging Review Dg Chest 2 View  12/24/2014   CLINICAL DATA:  Acute onset of body aches, nausea and diarrhea. Chest congestion and fever. Initial encounter.  EXAM: CHEST  2 VIEW  COMPARISON:  Chest radiograph performed 06/18/2011  FINDINGS: The lungs are well-aerated and clear. There is no evidence of focal opacification, pleural effusion or pneumothorax.  The heart is normal in size; the mediastinal contour is within normal limits. No acute osseous abnormalities are seen. Clips are noted within the right upper quadrant, reflecting prior cholecystectomy.  IMPRESSION: No acute cardiopulmonary process seen.   Electronically Signed   By: Garald Balding M.D.   On: 12/24/2014 23:11     EKG Interpretation None      MDM   Final diagnoses:  Cough  Gastroenteritis  Hypokalemia    Patient with a history of upper respiratory infection nausea no vomiting diarrhea chest congestion been on a Z-Pak. Chest x-rays negative for pneumonia. Symptoms seem to fit a viral upper respiratory and viral gastroenteritis type picture  without the vomiting. Abdomen is soft no acute abdominal process. Labs do show low white blood cell count suggestive of a virus. Mild hypokalemia. Patient given 10 mg potassium here and 40 mg potassium by mouth. The tendo Achillis was IV piggyback. Patient also hydrated. Patient seems to be improving. Patient will follow-up with her doctor have potassium rechecked. Patient will return for any new or worse symptoms. Patient we discharged  on Imodium right ear some pain medicine and antinausea medicine.  I personally performed the services described in this documentation, which was scribed in my presence. The recorded information has been reviewed and is accurate.       Fredia Sorrow, MD 12/24/14 [phone]

## 2014-12-25 DIAGNOSIS — R05 Cough: Secondary | ICD-10-CM | POA: Diagnosis not present

## 2015-04-17 ENCOUNTER — Encounter (HOSPITAL_COMMUNITY): Payer: Self-pay | Admitting: Emergency Medicine

## 2015-04-17 ENCOUNTER — Emergency Department (HOSPITAL_COMMUNITY)
Admission: EM | Admit: 2015-04-17 | Discharge: 2015-04-17 | Disposition: A | Discharge status: Home / Self Care | Payer: Medicare Other | Attending: Emergency Medicine | Admitting: Emergency Medicine

## 2015-04-17 DIAGNOSIS — F329 Major depressive disorder, single episode, unspecified: Secondary | ICD-10-CM | POA: Diagnosis not present

## 2015-04-17 DIAGNOSIS — Z720 Tobacco use: Secondary | ICD-10-CM | POA: Insufficient documentation

## 2015-04-17 DIAGNOSIS — Z79899 Other long term (current) drug therapy: Secondary | ICD-10-CM | POA: Insufficient documentation

## 2015-04-17 DIAGNOSIS — M549 Dorsalgia, unspecified: Secondary | ICD-10-CM | POA: Diagnosis present

## 2015-04-17 DIAGNOSIS — M1990 Unspecified osteoarthritis, unspecified site: Secondary | ICD-10-CM | POA: Diagnosis not present

## 2015-04-17 DIAGNOSIS — G8929 Other chronic pain: Secondary | ICD-10-CM | POA: Insufficient documentation

## 2015-04-17 DIAGNOSIS — Z8679 Personal history of other diseases of the circulatory system: Secondary | ICD-10-CM | POA: Insufficient documentation

## 2015-04-17 DIAGNOSIS — Z8742 Personal history of other diseases of the female genital tract: Secondary | ICD-10-CM | POA: Insufficient documentation

## 2015-04-17 DIAGNOSIS — M545 Low back pain: Secondary | ICD-10-CM | POA: Diagnosis not present

## 2015-04-17 MED ORDER — KETOROLAC TROMETHAMINE 10 MG PO TABS
10.0000 mg | ORAL_TABLET | Freq: Once | ORAL | Status: AC
  Administered 2015-04-17: 10 mg via ORAL
  Filled 2015-04-17: qty 1

## 2015-04-17 MED ORDER — DEXAMETHASONE 6 MG PO TABS: ORAL_TABLET | ORAL | Status: AC

## 2015-04-17 MED ORDER — CYCLOBENZAPRINE HCL 10 MG PO TABS: 10.0000 mg | ORAL_TABLET | Freq: Three times a day (TID) | ORAL | Status: AC

## 2015-04-17 MED ORDER — PREDNISONE 50 MG PO TABS
60.0000 mg | ORAL_TABLET | Freq: Once | ORAL | Status: AC
  Administered 2015-04-17: 60 mg via ORAL
  Filled 2015-04-17 (×2): qty 1

## 2015-04-17 MED ORDER — DIAZEPAM 5 MG PO TABS
5.0000 mg | ORAL_TABLET | Freq: Once | ORAL | Status: AC
Start: 2015-04-17 — End: 2015-04-17
  Administered 2015-04-17: 5 mg via ORAL
  Filled 2015-04-17: qty 1

## 2015-04-17 NOTE — ED Provider Notes (Signed)
CSN: [id number]     Arrival date & time 04/17/15  0023 History   First MD Initiated Contact with Patient 04/17/15 0036     Chief Complaint  Patient presents with  . Back Pain     (Consider location/radiation/quality/duration/timing/severity/associated sxs/prior Treatment) Patient is a 44 y.o. female presenting with back pain. The history is provided by the patient.  Back Pain Location:  Lumbar spine Quality:  Aching Radiates to:  L thigh Pain severity:  Moderate Pain is:  Same all the time Onset quality:  Gradual Duration: Acute on chronic. 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 Migraine   . Depression   . Seizures   . Chronic back pain   . Sciatica     right  . Arthritis   . Ovarian cyst    Past Surgical History  Procedure Laterality Date  . Abdominal hysterectomy    . Bladder surgery    . Cholecystectomy     History reviewed. No pertinent family history. History  Substance Use Topics  . Smoking status: Current Every Day Smoker -- 0.50 packs/day    Types: Cigarettes  . Smokeless tobacco: Not on file  . Alcohol Use: No     Comment: occasionally   OB History    No data available     Review of Systems  Gastrointestinal: Negative for bowel incontinence.  Genitourinary: Negative for bladder incontinence.  Musculoskeletal: Positive for back pain and arthralgias.  Psychiatric/Behavioral:       Depression  All other systems reviewed and are negative.     Allergies  Darvocet and Lodine  Home Medications   Prior to Admission medications    Medication Sig Start Date End Date Taking? Authorizing Provider  amphetamine-dextroamphetamine (ADDERALL) 10 MG tablet Take 10 mg by mouth 3 (three) times daily.    Yes Historical Provider, MD  azithromycin (ZITHROMAX) 250 MG tablet Take 250-500 mg by mouth See admin instructions. TWO TABLETS ON DAY 1 (12/21/2014) THEN TAKE ONE TABLET ON DAYS 2 THROUGH 5   Yes Historical Provider, MD  clonazePAM (KLONOPIN) 0.5 MG tablet Take 0.5 mg by mouth 2 (two) times daily as needed for anxiety.    Yes Historical Provider, MD  omeprazole (PRILOSEC) 20 MG capsule Take 20 mg by mouth daily.   Yes Historical Provider, MD  Phenylephrine-DM-GG 2.5-5-100 MG/5ML LIQD Take 10 mLs by mouth daily as needed (FOR CONGESTION).   Yes Historical Provider, MD  dexamethasone (DECADRON) 6 MG tablet 1 po bid with food Patient not taking: Reported on 12/24/2014 10/02/13   Lily Kocher, PA-C  diclofenac (VOLTAREN) 75 MG EC tablet Take 1 tablet (75 mg total) by mouth 2 (two) times daily. Patient not taking: Reported on 12/24/2014 10/02/13   Lily Kocher, PA-C  HYDROcodone-acetaminophen (NORCO/VICODIN) 5-325 MG per tablet Take 1-2 tablets by mouth every 6 (six) hours as needed. 12/24/14   Fredia Sorrow, MD  loperamide (IMODIUM A-D) 2 MG tablet Take 1 tablet (2 mg total) by mouth 4 (four) times daily as needed for diarrhea or loose stools. 12/24/14   Fredia Sorrow, MD  ondansetron (ZOFRAN ODT) 4 MG disintegrating  tablet Take 1 tablet (4 mg total) by mouth every 8 (eight) hours as needed. 12/24/14   Fredia Sorrow, MD  potassium chloride (K-DUR) 10 MEQ tablet Take 1 tablet (10 mEq total) by mouth 2 (two) times daily. 12/24/14   Fredia Sorrow, MD   BP 158/81 mmHg  Pulse 120  Temp(Src) 98.2 F (36.8 C)  Resp 18  Ht 5\' 4"  (1.626 m)  Wt 150 lb (68.04 kg)  BMI 25.73 kg/m2 Physical Exam  Constitutional: She is oriented to person, place, and time. She appears well-developed and well-nourished.  Non-toxic appearance.  HENT:  Head:  Normocephalic.  Right Ear: Tympanic membrane and external ear normal.  Left Ear: Tympanic membrane and external ear normal.  Eyes: EOM and lids are normal. Pupils are equal, round, and reactive to light.  Neck: Normal range of motion. Neck supple. Carotid bruit is not present.  Cardiovascular: Normal rate, regular rhythm, normal heart sounds, intact distal pulses and normal pulses.   Pulmonary/Chest: Breath sounds normal. No respiratory distress.  Abdominal: Soft. Bowel sounds are normal. There is no tenderness. There is no guarding.  Musculoskeletal: Normal range of motion.       Lumbar back: She exhibits tenderness, pain and spasm. She exhibits no deformity.  Lymphadenopathy:       Head (right side): No submandibular adenopathy present.       Head (left side): No submandibular adenopathy present.    She has no cervical adenopathy.  Neurological: She is alert and oriented to person, place, and time. She has normal strength. No cranial nerve deficit or sensory deficit.  Gait is steady. No foot drop noted. No sensory or motor deficits appreciated on examination.  Skin: Skin is warm and dry.  Psychiatric: She has a normal mood and affect. Her speech is normal.  Nursing note and vitals reviewed.   ED Course  Procedures (including critical care time) Labs Review Labs Reviewed - No data to display  Imaging Review No results found.   EKG Interpretation None      MDM  There is pain with range of motion of the lumbar spine. There is pain with leg raise on the left more than right. There is no muscle atrophy appreciated. There no gross neurologic deficits appreciated. In particular there is no evidence for cauda equina. 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 The plan at this time is for the patient to be treated  With Flexeril, Decadron, and ibuprofen 800 mg. Patient is to follow with Dr. Lorriane Shire for additional management  of this issue.    Final diagnoses:  None    *I have reviewed nursing notes, vital signs, and all appropriate lab and imaging results for this patient.[address] PA-C 04/17/15 0105  Merryl Hacker, MD 04/17/15 [phone]

## 2015-04-17 NOTE — ED Notes (Signed)
Pt c/o lower back pain x 2 months and states she has seen her pcp for the same and given steroids with no relief.

## 2015-04-17 NOTE — Discharge Instructions (Signed)
I have reviewed your previous MRIs, and they reveal bulging disc changes and arthritis changes involving her back. No new neurologic changes at this time. Please use Flexeril and Decadron and Tylenol for her discomfort. Please see Dr. Lorriane Shire as soon as possible in the office for assistance with her pain management. Chronic Back Pain  When back pain lasts longer than 3 months, it is called chronic back pain.People with chronic back pain often go through certain periods that are more intense (flare-ups).  CAUSES Chronic back pain can be caused by wear and tear (degeneration) on different structures in your back. These structures include:  The bones of your spine (vertebrae) and the joints surrounding your spinal cord and nerve roots (facets).  The strong, fibrous tissues that connect your vertebrae (ligaments). Degeneration of these structures may result in pressure on your nerves. This can lead to constant pain. HOME CARE INSTRUCTIONS  Avoid bending, heavy lifting, prolonged sitting, and activities which make the problem worse.  Take brief periods of rest throughout the day to reduce your pain. Lying down or standing usually is better than sitting while you are resting.  Take over-the-counter or prescription medicines only as directed by your caregiver. SEEK IMMEDIATE MEDICAL CARE IF:   You have weakness or numbness in one of your legs or feet.  You have trouble controlling your bladder or bowels.  You have nausea, vomiting, abdominal pain, shortness of breath, or fainting. Document Released: 11/19/2004 Document Revised: 01/04/2012 Document Reviewed: 09/26/2011 [facility] Patient Information 2015 [place], [place]. This information is not intended to replace advice given to you by your health care provider. Make sure you discuss any questions you have with your health care provider.

## 2015-04-23 ENCOUNTER — Emergency Department (HOSPITAL_COMMUNITY)
Admission: EM | Admit: 2015-04-23 | Discharge: 2015-04-23 | Disposition: A | Discharge status: Home / Self Care | Payer: Medicare Other | Attending: Emergency Medicine | Admitting: Emergency Medicine

## 2015-04-23 ENCOUNTER — Encounter (HOSPITAL_COMMUNITY): Payer: Self-pay | Admitting: *Deleted

## 2015-04-23 DIAGNOSIS — M5432 Sciatica, left side: Secondary | ICD-10-CM | POA: Insufficient documentation

## 2015-04-23 DIAGNOSIS — G8929 Other chronic pain: Secondary | ICD-10-CM | POA: Diagnosis not present

## 2015-04-23 DIAGNOSIS — R000 Tachycardia, unspecified: Secondary | ICD-10-CM | POA: Diagnosis not present

## 2015-04-23 DIAGNOSIS — G43909 Migraine, unspecified, not intractable, without status migrainosus: Secondary | ICD-10-CM | POA: Diagnosis not present

## 2015-04-23 DIAGNOSIS — Z791 Long term (current) use of non-steroidal anti-inflammatories (NSAID): Secondary | ICD-10-CM | POA: Insufficient documentation

## 2015-04-23 DIAGNOSIS — M549 Dorsalgia, unspecified: Secondary | ICD-10-CM | POA: Diagnosis present

## 2015-04-23 DIAGNOSIS — Z8742 Personal history of other diseases of the female genital tract: Secondary | ICD-10-CM | POA: Diagnosis not present

## 2015-04-23 DIAGNOSIS — M1990 Unspecified osteoarthritis, unspecified site: Secondary | ICD-10-CM | POA: Diagnosis not present

## 2015-04-23 DIAGNOSIS — Z79899 Other long term (current) drug therapy: Secondary | ICD-10-CM | POA: Insufficient documentation

## 2015-04-23 DIAGNOSIS — F329 Major depressive disorder, single episode, unspecified: Secondary | ICD-10-CM | POA: Insufficient documentation

## 2015-04-23 DIAGNOSIS — G40909 Epilepsy, unspecified, not intractable, without status epilepticus: Secondary | ICD-10-CM | POA: Diagnosis not present

## 2015-04-23 DIAGNOSIS — Z720 Tobacco use: Secondary | ICD-10-CM | POA: Diagnosis not present

## 2015-04-23 MED ORDER — GABAPENTIN 300 MG PO CAPS
300.0000 mg | ORAL_CAPSULE | Freq: Once | ORAL | Status: AC
Start: 2015-04-23 — End: 2015-04-23
  Administered 2015-04-23: 300 mg via ORAL
  Filled 2015-04-23: qty 1

## 2015-04-23 MED ORDER — GABAPENTIN 300 MG PO CAPS: 300.0000 mg | ORAL_CAPSULE | Freq: Two times a day (BID) | ORAL | Status: AC

## 2015-04-23 MED ORDER — HYDROMORPHONE HCL 1 MG/ML IJ SOLN
1.0000 mg | Freq: Once | INTRAMUSCULAR | Status: AC
  Administered 2015-04-23: 1 mg via INTRAMUSCULAR
  Filled 2015-04-23: qty 1

## 2015-04-23 MED ORDER — OXYCODONE-ACETAMINOPHEN 5-325 MG PO TABS: 1.0000 | ORAL_TABLET | Freq: Four times a day (QID) | ORAL | Status: AC | PRN

## 2015-04-23 MED ORDER — KETOROLAC TROMETHAMINE 60 MG/2ML IM SOLN
60.0000 mg | Freq: Once | INTRAMUSCULAR | Status: AC
Start: 2015-04-23 — End: 2015-04-23
  Administered 2015-04-23: 60 mg via INTRAMUSCULAR
  Filled 2015-04-23: qty 2

## 2015-04-23 NOTE — Discharge Instructions (Signed)
Sciatica Sciatica is pain, weakness, numbness, or tingling along the path of the sciatic nerve. The nerve starts in the lower back and runs down the back of each leg. The nerve controls the muscles in the lower leg and in the back of the knee, while also providing sensation to the back of the thigh, lower leg, and the sole of your foot. Sciatica is a symptom of another medical condition. For instance, nerve damage or certain conditions, such as a herniated disk or bone spur on the spine, pinch or put pressure on the sciatic nerve. This causes the pain, weakness, or other sensations normally associated with sciatica. Generally, sciatica only affects one side of the body. CAUSES   Herniated or slipped disc.  Degenerative disk disease.  A pain disorder involving the narrow muscle in the buttocks (piriformis syndrome).  Pelvic injury or fracture.  Pregnancy.  Tumor (rare). SYMPTOMS  Symptoms can vary from mild to very severe. The symptoms usually travel from the low back to the buttocks and down the back of the leg. Symptoms can include:  Mild tingling or dull aches in the lower back, leg, or hip.  Numbness in the back of the calf or sole of the foot.  Burning sensations in the lower back, leg, or hip.  Sharp pains in the lower back, leg, or hip.  Leg weakness.  Severe back pain inhibiting movement. These symptoms may get worse with coughing, sneezing, laughing, or prolonged sitting or standing. Also, being overweight may worsen symptoms. DIAGNOSIS  Your caregiver will perform a physical exam to look for common symptoms of sciatica. He or she may ask you to do certain movements or activities that would trigger sciatic nerve pain. Other tests may be performed to find the cause of the sciatica. These may include:  Blood tests.  X-rays.  Imaging tests, such as an MRI or CT scan. TREATMENT  Treatment is directed at the cause of the sciatic pain. Sometimes, treatment is not necessary  and the pain and discomfort goes away on its own. If treatment is needed, your caregiver may suggest:  Over-the-counter medicines to relieve pain.  Prescription medicines, such as anti-inflammatory medicine, muscle relaxants, or narcotics.  Applying heat or ice to the painful area.  Steroid injections to lessen pain, irritation, and inflammation around the nerve.  Reducing activity during periods of pain.  Exercising and stretching to strengthen your abdomen and improve flexibility of your spine. Your caregiver may suggest losing weight if the extra weight makes the back pain worse.  Physical therapy.  Surgery to eliminate what is pressing or pinching the nerve, such as a bone spur or part of a herniated disk. HOME CARE INSTRUCTIONS   Only take over-the-counter or prescription medicines for pain or discomfort as directed by your caregiver.  Apply ice to the affected area for 20 minutes, 3-4 times a day for the first 48-72 hours. Then try heat in the same way.  Exercise, stretch, or perform your usual activities if these do not aggravate your pain.  Attend physical therapy sessions as directed by your caregiver.  Keep all follow-up appointments as directed by your caregiver.  Do not wear high heels or shoes that do not provide proper support.  Check your mattress to see if it is too soft. A firm mattress may lessen your pain and discomfort. SEEK IMMEDIATE MEDICAL CARE IF:   You lose control of your bowel or bladder (incontinence).  You have increasing weakness in the lower back, pelvis, buttocks,   or legs.  You have redness or swelling of your back.  You have a burning sensation when you urinate.  You have pain that gets worse when you lie down or awakens you at night.  Your pain is worse than you have experienced in the past.  Your pain is lasting longer than 4 weeks.  You are suddenly losing weight without reason. MAKE SURE YOU:  Understand these  instructions.  Will watch your condition.  Will get help right away if you are not doing well or get worse. Document Released: 10/06/2001 Document Revised: 04/12/2012 Document Reviewed: 02/21/2012 [employer] Patient Information 2015 [employer]. This information is not intended to replace advice given to you by your health care provider. Make sure you discuss any questions you have with your health care provider.  

## 2015-04-23 NOTE — ED Notes (Signed)
Pt c/o continued back pain x 3 months. Pt states her PCP is giving her steroids with no relief.

## 2015-04-23 NOTE — ED Provider Notes (Signed)
CSN: [id number]     Arrival date & time 04/23/15  0110 History   First MD Initiated Contact with Patient 04/23/15 0143     Chief Complaint  Patient presents with  . Back Pain     (Consider location/radiation/quality/duration/timing/severity/associated sxs/prior Treatment) HPI  This a 44 year old female with a history of chronic back pain, sciatica who presents with back pain. Patient reports that she's been seen by her primary physician multiple times and then placed on sterile voice with minimal relief. She reports back pain that radiates down her left leg. Current pain is 10 out of 10. She is taking Aleve and ibuprofen at home with minimal relief. Denies any difficulty with her bowel or bladder. Denies any weakness or numbness of her legs. She was seen here on June 22 for the same. She was given muscle relaxers at that time. She states that those are not helping.  Past Medical History  Diagnosis Date  . Migraine   . Depression   . Seizures   . Chronic back pain   . Sciatica     right  . Arthritis   . Ovarian cyst    Past Surgical History  Procedure Laterality Date  . Abdominal hysterectomy    . Bladder surgery    . Cholecystectomy     History reviewed. No pertinent family history. History  Substance Use Topics  . Smoking status: Current Every Day Smoker -- 0.50 packs/day    Types: Cigarettes  . Smokeless tobacco: Not on file  . Alcohol Use: No     Comment: occasionally   OB History    No data available     Review of Systems  Constitutional: Negative for fever.  Cardiovascular: Negative for chest pain.  Gastrointestinal: Negative for abdominal pain.  Genitourinary: Negative for dysuria and difficulty urinating.  Musculoskeletal: Positive for back pain.  Neurological: Negative for weakness and numbness.  All other systems reviewed and are negative.     Allergies  Darvocet and Lodine  Home Medications   Prior to Admission medications   Medication Sig Start  Date End Date Taking? Authorizing Provider  amphetamine-dextroamphetamine (ADDERALL) 10 MG tablet Take 10 mg by mouth 3 (three) times daily.     Historical Provider, MD  azithromycin (ZITHROMAX) 250 MG tablet Take 250-500 mg by mouth See admin instructions. TWO TABLETS ON DAY 1 (12/21/2014) THEN TAKE ONE TABLET ON DAYS 2 THROUGH 5    Historical Provider, MD  clonazePAM (KLONOPIN) 0.5 MG tablet Take 0.5 mg by mouth 2 (two) times daily as needed for anxiety.     Historical Provider, MD  cyclobenzaprine (FLEXERIL) 10 MG tablet Take 1 tablet (10 mg total) by mouth 3 (three) times daily. 04/17/15   Lily Kocher, PA-C  dexamethasone (DECADRON) 6 MG tablet 1 po bid with food 04/17/15   Lily Kocher, PA-C  diclofenac (VOLTAREN) 75 MG EC tablet Take 1 tablet (75 mg total) by mouth 2 (two) times daily. Patient not taking: Reported on 12/24/2014 10/02/13   Lily Kocher, PA-C  gabapentin (NEURONTIN) 300 MG capsule Take 1 capsule (300 mg total) by mouth 2 (two) times daily. 04/23/15   Merryl Hacker, MD  HYDROcodone-acetaminophen (NORCO/VICODIN) 5-325 MG per tablet Take 1-2 tablets by mouth every 6 (six) hours as needed. 12/24/14   Fredia Sorrow, MD  loperamide (IMODIUM A-D) 2 MG tablet Take 1 tablet (2 mg total) by mouth 4 (four) times daily as needed for diarrhea or loose stools. 12/24/14   Fredia Sorrow, MD  omeprazole (PRILOSEC) 20 MG capsule Take 20 mg by mouth daily.    Historical Provider, MD  ondansetron (ZOFRAN ODT) 4 MG disintegrating tablet Take 1 tablet (4 mg total) by mouth every 8 (eight) hours as needed. 12/24/14   Fredia Sorrow, MD  oxyCODONE-acetaminophen (PERCOCET/ROXICET) 5-325 MG per tablet Take 1 tablet by mouth every 6 (six) hours as needed for severe pain. 04/23/15   Merryl Hacker, MD  Phenylephrine-DM-GG 2.5-5-100 MG/5ML LIQD Take 10 mLs by mouth daily as needed (FOR CONGESTION).    Historical Provider, MD  potassium chloride (K-DUR) 10 MEQ tablet Take 1 tablet (10 mEq total) by mouth  2 (two) times daily. 12/24/14   Fredia Sorrow, MD   BP 116/92 mmHg  Pulse 119  Temp(Src) 97.7 F (36.5 C)  Resp 20  Ht 5\' 4"  (1.626 m)  Wt 158 lb (71.668 kg)  BMI 27.11 kg/m2  SpO2 100% Physical Exam  Constitutional: She is oriented to person, place, and time. She appears well-developed and well-nourished. No distress.  HENT:  Head: Normocephalic and atraumatic.  Cardiovascular: Regular rhythm and normal heart sounds.   Tachycardia  Pulmonary/Chest: Effort normal. No respiratory distress. She has no wheezes.  Abdominal: Soft. Bowel sounds are normal.  Musculoskeletal:  No midline tenderness to palpation, step-off, or deformity, positive left straight leg raise  Neurological: She is alert and oriented to person, place, and time.  Normal reflexes bilateral lower extremities, normal gait, normal strength  Skin: Skin is warm and dry.  Psychiatric: She has a normal mood and affect.  Nursing note and vitals reviewed.   ED Course  Procedures (including critical care time) Labs Review Labs Reviewed - No data to display  Imaging Review No results found.   EKG Interpretation None      MDM   Final diagnoses:  Sciatica, left    Patient presents with acute on chronic back pain. History of sciatica. Reports that she is received steroid-induced and muscle relaxers and has been taking NSAIDs with minimal relief. Her primary physician is Dr. Lorriane Shire. Nontoxic on exam. No signs or symptoms of cauda equina. Patient given IM Toradol, Dilaudid, and gabapentin. Discussed with patient that she will need to follow-up with her primary physician for any significant course of pain medication. Will give her a 2 day prescription for oxycodone. When she was seen here recently she did not receive any narcotic pain medication.  She was told that she should not expect this from the ER and she needs to follow-up with her primary physician for any further medications.  After history, exam, and  medical workup I feel the patient has been appropriately medically screened and is safe for discharge home. Pertinent diagnoses were discussed with the patient. Patient was given return precautions.     Merryl Hacker, MD 04/23/15 [id number]

## 2015-05-03 ENCOUNTER — Other Ambulatory Visit (HOSPITAL_COMMUNITY): Payer: Self-pay | Admitting: Family Medicine

## 2015-05-03 ENCOUNTER — Encounter (HOSPITAL_COMMUNITY): Payer: Self-pay | Admitting: Emergency Medicine

## 2015-05-03 ENCOUNTER — Emergency Department (HOSPITAL_COMMUNITY)
Admission: EM | Admit: 2015-05-03 | Discharge: 2015-05-03 | Disposition: A | Discharge status: Home / Self Care | Payer: Medicare Other | Attending: Emergency Medicine | Admitting: Emergency Medicine

## 2015-05-03 DIAGNOSIS — M5442 Lumbago with sciatica, left side: Secondary | ICD-10-CM | POA: Insufficient documentation

## 2015-05-03 DIAGNOSIS — M1990 Unspecified osteoarthritis, unspecified site: Secondary | ICD-10-CM | POA: Diagnosis not present

## 2015-05-03 DIAGNOSIS — M79605 Pain in left leg: Secondary | ICD-10-CM | POA: Diagnosis present

## 2015-05-03 DIAGNOSIS — Z8742 Personal history of other diseases of the female genital tract: Secondary | ICD-10-CM | POA: Insufficient documentation

## 2015-05-03 DIAGNOSIS — Z79899 Other long term (current) drug therapy: Secondary | ICD-10-CM | POA: Diagnosis not present

## 2015-05-03 DIAGNOSIS — G43909 Migraine, unspecified, not intractable, without status migrainosus: Secondary | ICD-10-CM | POA: Insufficient documentation

## 2015-05-03 DIAGNOSIS — M545 Low back pain: Secondary | ICD-10-CM

## 2015-05-03 DIAGNOSIS — G8929 Other chronic pain: Secondary | ICD-10-CM | POA: Diagnosis not present

## 2015-05-03 DIAGNOSIS — Z720 Tobacco use: Secondary | ICD-10-CM | POA: Diagnosis not present

## 2015-05-03 DIAGNOSIS — F329 Major depressive disorder, single episode, unspecified: Secondary | ICD-10-CM | POA: Insufficient documentation

## 2015-05-03 MED ORDER — TRAMADOL HCL 50 MG PO TABS: 50.0000 mg | ORAL_TABLET | Freq: Four times a day (QID) | ORAL | Status: AC | PRN

## 2015-05-03 NOTE — ED Notes (Signed)
Pt reports leg pain that starts in her back, radiates through her buttocks and down her leg. Pt was placed on steroids by her PCP but reports no relief. Pt c/o bruising and wounds to her legs that develop from slight pressure.

## 2015-05-03 NOTE — ED Provider Notes (Signed)
CSN: [id number]     Arrival date & time 05/03/15  0931 History  This chart was scribed for Virgel Manifold, MD by Eustaquio Maize, ED Scribe. This patient was seen in room APA07/APA07 and the patient's care was started at 10:04 AM.   Chief Complaint  Patient presents with  . Leg Pain   The history is provided by the patient. No language interpreter was used.    HPI Comments: Katie Brady is a 44 y.o. female with hx sciatica who presents to the Emergency Department complaining of left lower back pain that radiates down left thigh x 2 months, gradually worsening. Pt has been taking steroids for the past 2 months without relief. Pt also notes urinary retention that began 1 day ago. She saw her PCP, Dr. Cindie Laroche, this morning as well. She was told to continue taking the steroids and to return in 2 weeks. She notes bruises and wound to bilateral lower extremities but denies any injury, trauma, or fall. She discussed these symptoms with Dr. Cindie Laroche who thought the symptoms weren't of any concern. Denies weakness, numbness, fever, chills, or any other associated symptoms. No previous back surgeries.   Past Medical History  Diagnosis Date  . Migraine   . Depression   . Seizures   . Chronic back pain   . Sciatica     right  . Arthritis   . Ovarian cyst    Past Surgical History  Procedure Laterality Date  . Abdominal hysterectomy    . Bladder surgery    . Cholecystectomy     Family History  Problem Relation Age of Onset  . Stroke Mother   . Cancer Mother   . Cancer Father   . Alzheimer's disease Other   . Hypertension Other   . Diabetes Other    History  Substance Use Topics  . Smoking status: Current Every Day Smoker -- 0.50 packs/day    Types: Cigarettes  . Smokeless tobacco: Not on file  . Alcohol Use: No     Comment: occasionally   OB History    Gravida Para Term Preterm AB TAB SAB Ectopic Multiple Living   3 2  2 1  1         Review of Systems  Constitutional: Negative for  fever and chills.  Genitourinary: Positive for difficulty urinating.  Musculoskeletal: Positive for back pain and arthralgias (Left thigh pain).  Skin: Positive for wound.  Neurological: Negative for weakness and numbness.  Hematological: Bruises/bleeds easily.  All other systems reviewed and are negative.   Allergies  Darvocet and Lodine  Home Medications   Prior to Admission medications   Medication Sig Start Date End Date Taking? Authorizing Provider  amphetamine-dextroamphetamine (ADDERALL) 10 MG tablet Take 10 mg by mouth 3 (three) times daily.     Historical Provider, MD  azithromycin (ZITHROMAX) 250 MG tablet Take 250-500 mg by mouth See admin instructions. TWO TABLETS ON DAY 1 (12/21/2014) THEN TAKE ONE TABLET ON DAYS 2 THROUGH 5    Historical Provider, MD  clonazePAM (KLONOPIN) 0.5 MG tablet Take 0.5 mg by mouth 2 (two) times daily as needed for anxiety.     Historical Provider, MD  cyclobenzaprine (FLEXERIL) 10 MG tablet Take 1 tablet (10 mg total) by mouth 3 (three) times daily. 04/17/15   Lily Kocher, PA-C  dexamethasone (DECADRON) 6 MG tablet 1 po bid with food 04/17/15   Lily Kocher, PA-C  diclofenac (VOLTAREN) 75 MG EC tablet Take 1 tablet (75 mg total) by  mouth 2 (two) times daily. Patient not taking: Reported on 12/24/2014 10/02/13   Lily Kocher, PA-C  gabapentin (NEURONTIN) 300 MG capsule Take 1 capsule (300 mg total) by mouth 2 (two) times daily. 04/23/15   Merryl Hacker, MD  HYDROcodone-acetaminophen (NORCO/VICODIN) 5-325 MG per tablet Take 1-2 tablets by mouth every 6 (six) hours as needed. 12/24/14   Fredia Sorrow, MD  loperamide (IMODIUM A-D) 2 MG tablet Take 1 tablet (2 mg total) by mouth 4 (four) times daily as needed for diarrhea or loose stools. 12/24/14   Fredia Sorrow, MD  omeprazole (PRILOSEC) 20 MG capsule Take 20 mg by mouth daily.    Historical Provider, MD  ondansetron (ZOFRAN ODT) 4 MG disintegrating tablet Take 1 tablet (4 mg total) by mouth every  8 (eight) hours as needed. 12/24/14   Fredia Sorrow, MD  oxyCODONE-acetaminophen (PERCOCET/ROXICET) 5-325 MG per tablet Take 1 tablet by mouth every 6 (six) hours as needed for severe pain. 04/23/15   Merryl Hacker, MD  Phenylephrine-DM-GG 2.5-5-100 MG/5ML LIQD Take 10 mLs by mouth daily as needed (FOR CONGESTION).    Historical Provider, MD  potassium chloride (K-DUR) 10 MEQ tablet Take 1 tablet (10 mEq total) by mouth 2 (two) times daily. 12/24/14   Fredia Sorrow, MD   Triage Vitals: BP 151/82 mmHg  Pulse 112  Temp(Src) 98.4 F (36.9 C) (Oral)  Resp 16  Ht 5\' 4"  (1.626 m)  Wt 158 lb (71.668 kg)  BMI 27.11 kg/m2  SpO2 99%   Physical Exam  Constitutional: She is oriented to person, place, and time. She appears well-developed and well-nourished. No distress.  HENT:  Head: Normocephalic and atraumatic.  Eyes: Conjunctivae and EOM are normal.  Neck: Neck supple. No tracheal deviation present.  Cardiovascular: Regular rhythm and normal heart sounds.   Pulmonary/Chest: Effort normal. No respiratory distress.  Musculoskeletal: Normal range of motion. She exhibits tenderness.  Able to stand from seated position without any apparent difficulty Tenderness left lower back and left buttock Sensation intact to light touch  Strength 5/5 bilateral lower extremities   Neurological: She is alert and oriented to person, place, and time.  Skin: Skin is warm and dry.  Psychiatric: She has a normal mood and affect. Her behavior is normal.  Nursing note and vitals reviewed.   ED Course  Procedures (including critical care time)  DIAGNOSTIC STUDIES: Oxygen Saturation is 99% on RA, normal by my interpretation.    COORDINATION OF CARE: 10:09 AM-Discussed treatment plan which includes RX short dose of pain medication and referral to follow up with pain management with pt at bedside and pt agreed to plan.   Labs Review Labs Reviewed - No data to display  Imaging Review No results found.    EKG Interpretation None      MDM   Final diagnoses:  Left-sided low back pain with left-sided sciatica   44 year old female with lower back pain. No concerning "red flags.". Plan symptomatic treatment.  I personally preformed the services scribed in my presence. The recorded information has been reviewed is accurate. Virgel Manifold, MD.      Virgel Manifold, MD 05/10/15 1420

## 2015-05-03 NOTE — Discharge Instructions (Signed)
Back Pain, Adult °Back pain is very common. The pain often gets better over time. The cause of back pain is usually not dangerous. Most people can learn to manage their back pain on their own.  °HOME CARE  °· Stay active. Start with short walks on flat ground if you can. Try to walk farther each day. °· Do not sit, drive, or stand in one place for more than 30 minutes. Do not stay in bed. °· Do not avoid exercise or work. Activity can help your back heal faster. °· Be careful when you bend or lift an object. Bend at your knees, keep the object close to you, and do not twist. °· Sleep on a firm mattress. Lie on your side, and bend your knees. If you lie on your back, put a pillow under your knees. °· Only take medicines as told by your doctor. °· Put ice on the injured area. °¨ Put ice in a plastic bag. °¨ Place a towel between your skin and the bag. °¨ Leave the ice on for 15-20 minutes, 03-04 times a day for the first 2 to 3 days. After that, you can switch between ice and heat packs. °· Ask your doctor about back exercises or massage. °· Avoid feeling anxious or stressed. Find good ways to deal with stress, such as exercise. °GET HELP RIGHT AWAY IF:  °· Your pain does not go away with rest or medicine. °· Your pain does not go away in 1 week. °· You have new problems. °· You do not feel well. °· The pain spreads into your legs. °· You cannot control when you poop (bowel movement) or pee (urinate). °· Your arms or legs feel weak or lose feeling (numbness). °· You feel sick to your stomach (nauseous) or throw up (vomit). °· You have belly (abdominal) pain. °· You feel like you may pass out (faint). °MAKE SURE YOU:  °· Understand these instructions. °· Will watch your condition. °· Will get help right away if you are not doing well or get worse. °Document Released: 03/30/2008 Document Revised: 01/04/2012 Document Reviewed: 02/13/2014 °ExitCare® Patient Information ©2015 [employer]. This information is not intended  to replace advice given to you by your health care provider. Make sure you discuss any questions you have with your health care provider. ° °

## 2015-05-15 ENCOUNTER — Ambulatory Visit (HOSPITAL_COMMUNITY)
Admission: RE | Admit: 2015-05-15 | Discharge: 2015-05-15 | Disposition: A | Discharge status: Home / Self Care | Payer: Medicare Other | Source: Ambulatory Visit | Attending: Family Medicine | Admitting: Family Medicine

## 2015-05-15 DIAGNOSIS — M545 Low back pain: Secondary | ICD-10-CM | POA: Insufficient documentation

## 2015-05-15 DIAGNOSIS — M79605 Pain in left leg: Secondary | ICD-10-CM | POA: Diagnosis not present

## 2015-05-22 ENCOUNTER — Other Ambulatory Visit: Payer: Self-pay | Admitting: Neurosurgery

## 2015-05-22 DIAGNOSIS — M5126 Other intervertebral disc displacement, lumbar region: Secondary | ICD-10-CM

## 2015-05-27 ENCOUNTER — Ambulatory Visit
Admission: RE | Admit: 2015-05-27 | Discharge: 2015-05-27 | Disposition: A | Discharge status: Home / Self Care | Payer: Medicare Other | Source: Ambulatory Visit | Attending: Neurosurgery | Admitting: Neurosurgery

## 2015-05-27 DIAGNOSIS — M5126 Other intervertebral disc displacement, lumbar region: Secondary | ICD-10-CM

## 2015-05-27 MED ORDER — METHYLPREDNISOLONE ACETATE 40 MG/ML INJ SUSP (RADIOLOG
120.0000 mg | Freq: Once | INTRAMUSCULAR | Status: AC
  Administered 2015-05-27: 120 mg via EPIDURAL

## 2015-05-27 MED ORDER — IOHEXOL 180 MG/ML  SOLN
1.0000 mL | Freq: Once | INTRAMUSCULAR | Status: AC | PRN
  Administered 2015-05-27: 1 mL via EPIDURAL

## 2015-05-27 NOTE — Discharge Instructions (Signed)

## 2015-06-24 ENCOUNTER — Other Ambulatory Visit: Payer: Self-pay | Admitting: Neurosurgery

## 2015-07-05 ENCOUNTER — Other Ambulatory Visit: Payer: Self-pay | Admitting: Neurosurgery

## 2015-07-05 DIAGNOSIS — M5126 Other intervertebral disc displacement, lumbar region: Secondary | ICD-10-CM

## 2015-07-09 ENCOUNTER — Inpatient Hospital Stay (HOSPITAL_COMMUNITY): Admission: RE | Admit: 2015-07-09 | Payer: Medicare Other | Source: Ambulatory Visit

## 2015-07-17 ENCOUNTER — Ambulatory Visit: Admit: 2015-07-17 | Payer: Medicare Other | Admitting: Neurosurgery

## 2015-07-17 SURGERY — LUMBAR LAMINECTOMY/DECOMPRESSION MICRODISCECTOMY 1 LEVEL
Anesthesia: General | Laterality: Left

## 2015-07-23 ENCOUNTER — Other Ambulatory Visit: Payer: Medicare Other

## 2015-07-23 ENCOUNTER — Inpatient Hospital Stay
Admission: RE | Admit: 2015-07-23 | Discharge: 2015-07-23 | Disposition: A | Discharge status: Home / Self Care | Payer: Medicare Other | Source: Ambulatory Visit | Attending: Neurosurgery | Admitting: Neurosurgery

## 2015-07-23 NOTE — Discharge Instructions (Signed)
Myelogram Discharge Instructions  1. Go home and rest quietly for the next 24 hours.  It is important to lie flat for the next 24 hours.  Get up only to go to the restroom.  You may lie in the bed or on a couch on your back, your stomach, your left side or your right side.  You may have one pillow under your head.  You may have pillows between your knees while you are on your side or under your knees while you are on your back.  2. DO NOT drive today.  Recline the seat as far back as it will go, while still wearing your seat belt, on the way home.  3. You may get up to go to the bathroom as needed.  You may sit up for 10 minutes to eat.  You may resume your normal diet and medications unless otherwise indicated.  Drink lots of extra fluids today and tomorrow.  4. The incidence of headache, nausea, or vomiting is about 5% (one in 20 patients).  If you develop a headache, lie flat and drink plenty of fluids until the headache goes away.  Caffeinated beverages may be helpful.  If you develop severe nausea and vomiting or a headache that does not go away with flat bed rest, call [phone].  5. You may resume normal activities after your 24 hours of bed rest is over; however, do not exert yourself strongly or do any heavy lifting tomorrow. If when you get up you have a headache when standing, go back to bed and force fluids for another 24 hours.  6. Call your physician for a follow-up appointment.  The results of your myelogram will be sent directly to your physician by the following day.  7. If you have any questions or if complications develop after you arrive home, please call [phone].  Discharge instructions have been explained to the patient.  The patient, or the person responsible for the patient, fully understands these instructions.       May resume Adderall on Sept. 28, 2016, after 9:30 am.

## 2015-08-02 ENCOUNTER — Ambulatory Visit
Admission: RE | Admit: 2015-08-02 | Discharge: 2015-08-02 | Disposition: A | Discharge status: Home / Self Care | Payer: Medicare Other | Source: Ambulatory Visit | Attending: Neurosurgery | Admitting: Neurosurgery

## 2015-08-02 VITALS — BP 110/65 | HR 77

## 2015-08-02 DIAGNOSIS — F4010 Social phobia, unspecified: Secondary | ICD-10-CM | POA: Insufficient documentation

## 2015-08-02 DIAGNOSIS — F4310 Post-traumatic stress disorder, unspecified: Secondary | ICD-10-CM | POA: Insufficient documentation

## 2015-08-02 DIAGNOSIS — M5137 Other intervertebral disc degeneration, lumbosacral region: Secondary | ICD-10-CM

## 2015-08-02 DIAGNOSIS — Z8261 Family history of arthritis: Secondary | ICD-10-CM | POA: Insufficient documentation

## 2015-08-02 DIAGNOSIS — Z9189 Other specified personal risk factors, not elsewhere classified: Secondary | ICD-10-CM | POA: Insufficient documentation

## 2015-08-02 DIAGNOSIS — F410 Panic disorder [episodic paroxysmal anxiety] without agoraphobia: Secondary | ICD-10-CM | POA: Insufficient documentation

## 2015-08-02 DIAGNOSIS — M5126 Other intervertebral disc displacement, lumbar region: Secondary | ICD-10-CM

## 2015-08-02 DIAGNOSIS — T7411XA Adult physical abuse, confirmed, initial encounter: Secondary | ICD-10-CM | POA: Insufficient documentation

## 2015-08-02 DIAGNOSIS — Z811 Family history of alcohol abuse and dependence: Secondary | ICD-10-CM | POA: Insufficient documentation

## 2015-08-02 DIAGNOSIS — F1910 Other psychoactive substance abuse, uncomplicated: Secondary | ICD-10-CM | POA: Insufficient documentation

## 2015-08-02 DIAGNOSIS — M419 Scoliosis, unspecified: Secondary | ICD-10-CM | POA: Insufficient documentation

## 2015-08-02 DIAGNOSIS — J309 Allergic rhinitis, unspecified: Secondary | ICD-10-CM | POA: Insufficient documentation

## 2015-08-02 DIAGNOSIS — M51379 Other intervertebral disc degeneration, lumbosacral region without mention of lumbar back pain or lower extremity pain: Secondary | ICD-10-CM

## 2015-08-02 DIAGNOSIS — G4700 Insomnia, unspecified: Secondary | ICD-10-CM | POA: Insufficient documentation

## 2015-08-02 DIAGNOSIS — M545 Low back pain, unspecified: Secondary | ICD-10-CM | POA: Insufficient documentation

## 2015-08-02 DIAGNOSIS — G43909 Migraine, unspecified, not intractable, without status migrainosus: Secondary | ICD-10-CM | POA: Insufficient documentation

## 2015-08-02 DIAGNOSIS — R634 Abnormal weight loss: Secondary | ICD-10-CM | POA: Insufficient documentation

## 2015-08-02 DIAGNOSIS — F17200 Nicotine dependence, unspecified, uncomplicated: Secondary | ICD-10-CM | POA: Insufficient documentation

## 2015-08-02 DIAGNOSIS — G40909 Epilepsy, unspecified, not intractable, without status epilepticus: Secondary | ICD-10-CM | POA: Insufficient documentation

## 2015-08-02 DIAGNOSIS — Z809 Family history of malignant neoplasm, unspecified: Secondary | ICD-10-CM | POA: Insufficient documentation

## 2015-08-02 DIAGNOSIS — E669 Obesity, unspecified: Secondary | ICD-10-CM | POA: Insufficient documentation

## 2015-08-02 DIAGNOSIS — K219 Gastro-esophageal reflux disease without esophagitis: Secondary | ICD-10-CM | POA: Insufficient documentation

## 2015-08-02 MED ORDER — DIAZEPAM 5 MG PO TABS
10.0000 mg | ORAL_TABLET | Freq: Once | ORAL | Status: AC
  Administered 2015-08-02: 10 mg via ORAL

## 2015-08-02 MED ORDER — IOHEXOL 180 MG/ML  SOLN
15.0000 mL | Freq: Once | INTRAMUSCULAR | Status: DC | PRN
  Administered 2015-08-02: 15 mL via INTRAVENOUS

## 2015-08-02 NOTE — Discharge Instructions (Signed)
Myelogram Discharge Instructions  1. Go home and rest quietly for the next 24 hours.  It is important to lie flat for the next 24 hours.  Get up only to go to the restroom.  You may lie in the bed or on a couch on your back, your stomach, your left side or your right side.  You may have one pillow under your head.  You may have pillows between your knees while you are on your side or under your knees while you are on your back.  2. DO NOT drive today.  Recline the seat as far back as it will go, while still wearing your seat belt, on the way home.  3. You may get up to go to the bathroom as needed.  You may sit up for 10 minutes to eat.  You may resume your normal diet and medications unless otherwise indicated.  Drink lots of extra fluids today and tomorrow.  4. The incidence of headache, nausea, or vomiting is about 5% (one in 20 patients).  If you develop a headache, lie flat and drink plenty of fluids until the headache goes away.  Caffeinated beverages may be helpful.  If you develop severe nausea and vomiting or a headache that does not go away with flat bed rest, call [phone].  5. You may resume normal activities after your 24 hours of bed rest is over; however, do not exert yourself strongly or do any heavy lifting tomorrow. If when you get up you have a headache when standing, go back to bed and force fluids for another 24 hours.  6. Call your physician for a follow-up appointment.  The results of your myelogram will be sent directly to your physician by the following day.  7. If you have any questions or if complications develop after you arrive home, please call [phone].  Discharge instructions have been explained to the patient.  The patient, or the person responsible for the patient, fully understands these instructions.       May resume Adderall on Oct. 8, 2016, after 9:30 am.

## 2015-08-02 NOTE — Progress Notes (Signed)
Patient states she has been off Adderall for at least the past two days.  Brita Romp, RN

## 2015-11-05 ENCOUNTER — Other Ambulatory Visit: Payer: Self-pay | Admitting: Neurosurgery

## 2015-11-15 ENCOUNTER — Other Ambulatory Visit (HOSPITAL_COMMUNITY): Payer: Self-pay | Admitting: *Deleted

## 2015-11-15 ENCOUNTER — Inpatient Hospital Stay (HOSPITAL_COMMUNITY)
Admission: RE | Admit: 2015-11-15 | Discharge: 2015-11-15 | Disposition: A | Discharge status: Home / Self Care | Payer: Medicare Other | Source: Ambulatory Visit

## 2015-11-15 NOTE — Progress Notes (Signed)
Patient called and stated she is in court for her grandson and will not be at preadmission today.  Requesting to reschedule.  Have informed Baker Janus.

## 2015-11-15 NOTE — Pre-Procedure Instructions (Signed)
Katie Brady  11/15/2015      [facility] - [place], [place] [address] Phone: [phone] Fax: [phone]    Your procedure is scheduled on Tuesday, November 19, 2015 at 8:00 AM.   Report to [facility] Entrance "A" Admitting Office at 6:00 AM.   Call this number if you have problems the morning of surgery: [phone]    Remember:  Do not eat food or drink liquids after midnight Monday, 11/18/15.  Take these medicines the morning of surgery with A SIP OF WATER: Omeprazole (Prilosec), Oxycodone - if needed   Do not wear jewelry, make-up or nail polish.  Do not wear lotions, powders, or perfumes.  You may wear deodorant.  Do not shave 48 hours prior to surgery.    Do not bring valuables to the hospital.  [facility] is not responsible for any belongings or valuables.  Contacts, dentures or bridgework may not be worn into surgery.  Leave your suitcase in the car.  After surgery it may be brought to your room.  For patients admitted to the hospital, discharge time will be determined by your treatment team.  Special instructions:  [facility] - Preparing for Surgery  Before surgery, you can play an important role.  Because skin is not sterile, your skin needs to be as free of germs as possible.  You can reduce the number of germs on you skin by washing with CHG (chlorahexidine gluconate) soap before surgery.  CHG is an antiseptic cleaner which kills germs and bonds with the skin to continue killing germs even after washing.  Please DO NOT use if you have an allergy to CHG or antibacterial soaps.  If your skin becomes reddened/irritated stop using the CHG and inform your nurse when you arrive at Short Stay.  Do not shave (including legs and underarms) for at least 48 hours prior to the first CHG shower.  You may shave your face.  Please follow these instructions carefully:   1.  Shower with CHG Soap the night before  surgery and the                                morning of Surgery.  2.  If you choose to wash your hair, wash your hair first as usual with your       normal shampoo.  3.  After you shampoo, rinse your hair and body thoroughly to remove the                      Shampoo.  4.  Use CHG as you would any other liquid soap.  You can apply chg directly       to the skin and wash gently with scrungie or a clean washcloth.  5.  Apply the CHG Soap to your body ONLY FROM THE NECK DOWN.        Do not use on open wounds or open sores.  Avoid contact with your eyes, ears, mouth and genitals (private parts).  Wash genitals (private parts) with your normal soap.  6.  Wash thoroughly, paying special attention to the area where your surgery        will be performed.  7.  Thoroughly rinse your body with warm water from the neck down.  8.  DO NOT shower/wash with your  normal soap after using and rinsing off       the CHG Soap.  9.  Pat yourself dry with a clean towel.            10.  Wear clean pajamas.            11.  Place clean sheets on your bed the night of your first shower and do not        sleep with pets.  Day of Surgery  Do not apply any lotions the morning of surgery.  Please wear clean clothes to the hospital.   Please read over the following fact sheets that you were given. Pain Booklet, Coughing and Deep Breathing, MRSA Information and Surgical Site Infection Prevention

## 2015-11-15 NOTE — Pre-Procedure Instructions (Addendum)
    Katie Brady  11/15/2015      [facility] - [place], [place] [address] Phone: [phone] Fax: [phone]    Your procedure is scheduled on Tuesday, November 19, 2015             Report to [facility] Entrance "A" Admitting Office at 6:00 AM.               Your surgery or procedure is scheduled for 8:00 AM   Call this number if you have problems the morning of surgery: [phone]    Remember:  Do not eat food or drink liquids after midnight Monday, 11/18/15.  Take these medicines the morning of surgery with A SIP OF WATER: Omeprazole (Prilosec),                Oxycodone,  clonazePAM (KLONOPIN)  - if needed   Do not wear jewelry, make-up or nail polish.  Do not wear lotions, powders, or perfumes.    Do not shave 48 hours prior to surgery.    Do not bring valuables to the hospital.  [facility] is not responsible for any belongings or valuables.  Contacts, dentures or bridgework may not be worn into surgery.  Leave your suitcase in the car.  After surgery it may be brought to your room.  For patients admitted to the hospital, discharge time will be determined by your treatment team.  Please read over the following fact sheets that you were given. Pain Booklet, Coughing and Deep Breathing, MRSA Information and Surgical Site Infection Prevention

## 2015-11-15 NOTE — Progress Notes (Signed)
Have attempted to call patient x2.  Left message for patient regarding missed PAT appt and instructions to call back to reschedule.

## 2015-11-18 ENCOUNTER — Encounter (HOSPITAL_COMMUNITY): Payer: Self-pay

## 2015-11-18 ENCOUNTER — Encounter (HOSPITAL_COMMUNITY)
Admission: RE | Admit: 2015-11-18 | Discharge: 2015-11-18 | Disposition: A | Discharge status: Home / Self Care | Payer: Medicare Other | Source: Ambulatory Visit | Attending: Neurosurgery | Admitting: Neurosurgery

## 2015-11-18 HISTORY — DX: Adverse effect of unspecified anesthetic, initial encounter: T41.45XA

## 2015-11-18 HISTORY — DX: Reserved for inherently not codable concepts without codable children: IMO0001

## 2015-11-18 HISTORY — DX: Attention-deficit hyperactivity disorder, unspecified type: F90.9

## 2015-11-18 HISTORY — DX: Gastro-esophageal reflux disease without esophagitis: K21.9

## 2015-11-18 HISTORY — DX: Anxiety disorder, unspecified: F41.9

## 2015-11-18 HISTORY — DX: Malignant (primary) neoplasm, unspecified: C80.1

## 2015-11-18 HISTORY — DX: Other complications of anesthesia, initial encounter: T88.59XA

## 2015-11-18 LAB — BASIC METABOLIC PANEL
Anion gap: 14 (ref 5–15)
BUN: 10 mg/dL (ref 6–20)
CALCIUM: 9.7 mg/dL (ref 8.9–10.3)
CO2: 21 mmol/L — AB (ref 22–32)
CREATININE: 0.77 mg/dL (ref 0.44–1.00)
Chloride: 105 mmol/L (ref 101–111)
GLUCOSE: 115 mg/dL — AB (ref 65–99)
Potassium: 4.8 mmol/L (ref 3.5–5.1)
Sodium: 140 mmol/L (ref 135–145)

## 2015-11-18 LAB — CBC
HCT: 41.6 % (ref 36.0–46.0)
Hemoglobin: 14.3 g/dL (ref 12.0–15.0)
MCH: 31.2 pg (ref 26.0–34.0)
MCHC: 34.4 g/dL (ref 30.0–36.0)
MCV: 90.6 fL (ref 78.0–100.0)
Platelets: 235 10*3/uL (ref 150–400)
RBC: 4.59 MIL/uL (ref 3.87–5.11)
RDW: 13 % (ref 11.5–15.5)
WBC: 6.4 10*3/uL (ref 4.0–10.5)

## 2015-11-18 LAB — SURGICAL PCR SCREEN
MRSA, PCR: NEGATIVE
Staphylococcus aureus: NEGATIVE

## 2015-11-18 MED ORDER — CEFAZOLIN SODIUM-DEXTROSE 2-3 GM-% IV SOLR
2.0000 g | INTRAVENOUS | Status: AC
  Administered 2015-11-19: 2 g via INTRAVENOUS
  Filled 2015-11-18 (×2): qty 50

## 2015-11-18 NOTE — H&P (Signed)
Katie Brady is an 45 y.o. female.   Chief Complaint: left leg pain TLX:[id number] complaining of lumbar pain with radiation to the left leg, no better with conservative treatment.had a mri lumbar and a emg of both legs which were normal  Past Medical History  Diagnosis Date  . Depression   . Chronic back pain   . Arthritis   . Ovarian cyst   . Complication of anesthesia     woke up on last tooth removal - >10 years ago  . Shortness of breath dyspnea     with panic attack  . Seizures ([place])     last one 2006, no longer on medication- dose lowered and d/ced ? stress induced  . Migraine     not recent   . Sciatica     left  . Anxiety     Panic attacks  . ADHD (attention deficit hyperactivity disorder)   . GERD (gastroesophageal reflux disease)   . Cancer [facility])     uterine    Past Surgical History  Procedure Laterality Date  . Abdominal hysterectomy    . Bladder surgery      x2  bladder nicked during Hysterectomy  . Cholecystectomy      Family History  Problem Relation Age of Onset  . Stroke Mother   . Cancer Mother   . Cancer Father   . Alzheimer's disease Other   . Hypertension Other   . Diabetes Other    Social History:  reports that she has been smoking Cigarettes.  She has a 15.5 pack-year smoking history. She does not have any smokeless tobacco history on file. She reports that she does not drink alcohol or use illicit drugs.  Allergies:  Allergies  Allergen Reactions  . Lodine [Etodolac] Anaphylaxis and Swelling    Tongue swelling  . Tramadol Anaphylaxis and Swelling    Tongue and throat swelling, difficulty breathing  . Darvocet [Propoxyphene N-Acetaminophen] Other (See Comments)    Migraine headache    No prescriptions prior to admission    Results for orders placed or performed during the hospital encounter of 11/18/15 (from the past 48 hour(s))  Surgical pcr screen     Status: None   Collection Time: 11/18/15  9:58 AM  Result Value Ref Range   MRSA, PCR NEGATIVE NEGATIVE   Staphylococcus aureus NEGATIVE NEGATIVE    Comment:        The Xpert SA Assay (FDA approved for NASAL specimens in patients over 43 years of age), is one component of a comprehensive surveillance program.  Test performance has been validated by [facility] for patients greater than or equal to 57 year old. It is not intended to diagnose infection nor to guide or monitor treatment.   CBC     Status: None   Collection Time: 11/18/15  9:58 AM  Result Value Ref Range   WBC 6.4 4.0 - 10.5 K/uL   RBC 4.59 3.87 - 5.11 MIL/uL   Hemoglobin 14.3 12.0 - 15.0 g/dL   HCT 41.6 36.0 - 46.0 %   MCV 90.6 78.0 - 100.0 fL   MCH 31.2 26.0 - 34.0 pg   MCHC 34.4 30.0 - 36.0 g/dL   RDW 13.0 11.5 - 15.5 %   Platelets 235 150 - 400 K/uL  Basic metabolic panel     Status: Abnormal   Collection Time: 11/18/15  9:58 AM  Result Value Ref Range   Sodium 140 135 - 145 mmol/L   Potassium 4.8  3.5 - 5.1 mmol/L   Chloride 105 101 - 111 mmol/L   CO2 21 (L) 22 - 32 mmol/L   Glucose, Bld 115 (H) 65 - 99 mg/dL   BUN 10 6 - 20 mg/dL   Creatinine, Ser 0.77 0.44 - 1.00 mg/dL   Calcium 9.7 8.9 - 10.3 mg/dL   GFR calc non Af Amer >60 >60 mL/min   GFR calc Af Amer >60 >60 mL/min    Comment: (NOTE) The eGFR has been calculated using the CKD EPI equation. This calculation has not been validated in all clinical situations. eGFR's persistently <60 mL/min signify possible Chronic Kidney Disease.    Anion gap 14 5 - 15   No results found.  Review of Systems  Constitutional: Negative.   Eyes: Negative.   Respiratory: Positive for shortness of breath.   Cardiovascular: Negative.   Gastrointestinal: Negative.   Genitourinary: Negative.   Musculoskeletal: Positive for back pain and neck pain.  Skin: Negative.   Neurological: Positive for sensory change and focal weakness.  Endo/Heme/Allergies: Negative.   Psychiatric/Behavioral: The patient is nervous/anxious.     There were no  vitals taken for this visit. Physical Exam hent,nl. Neck, some pain with mobility. Cv, nl. Lungs, some rales. Abdomen ,nl. Extremities,nl. NEURO  WEAKNESS OF DF IN THE LEFT LEG.straight leg-raise positive in the left at 45 degrees. Myelogram showed ddd at l5-s1  Left worse than right.  Assessment/Plan Patient to go ahead with surgery. He is aware of risks and complications. It will be a left l5s1 laminotomy and foraminotomy  Trinty Marken M 11/18/2015, 2:41 PM

## 2015-11-19 ENCOUNTER — Ambulatory Visit (HOSPITAL_COMMUNITY): Payer: Medicare Other

## 2015-11-19 ENCOUNTER — Ambulatory Visit (HOSPITAL_COMMUNITY): Payer: Medicare Other | Admitting: Certified Registered Nurse Anesthetist

## 2015-11-19 ENCOUNTER — Inpatient Hospital Stay (HOSPITAL_COMMUNITY)
Admission: RE | Admit: 2015-11-19 | Discharge: 2015-11-20 | DRG: 519 | Disposition: A | Discharge status: Home / Self Care | Payer: Medicare Other | Source: Ambulatory Visit | Attending: Neurosurgery | Admitting: Neurosurgery

## 2015-11-19 ENCOUNTER — Encounter (HOSPITAL_COMMUNITY)
Admission: RE | Disposition: A | Discharge status: Home / Self Care | Payer: Self-pay | Source: Ambulatory Visit | Attending: Neurosurgery

## 2015-11-19 ENCOUNTER — Encounter (HOSPITAL_COMMUNITY): Payer: Self-pay | Admitting: Certified Registered Nurse Anesthetist

## 2015-11-19 DIAGNOSIS — Z419 Encounter for procedure for purposes other than remedying health state, unspecified: Secondary | ICD-10-CM

## 2015-11-19 DIAGNOSIS — G9741 Accidental puncture or laceration of dura during a procedure: Secondary | ICD-10-CM | POA: Diagnosis not present

## 2015-11-19 DIAGNOSIS — K219 Gastro-esophageal reflux disease without esophagitis: Secondary | ICD-10-CM | POA: Diagnosis present

## 2015-11-19 DIAGNOSIS — M4806 Spinal stenosis, lumbar region: Principal | ICD-10-CM | POA: Diagnosis present

## 2015-11-19 DIAGNOSIS — F17210 Nicotine dependence, cigarettes, uncomplicated: Secondary | ICD-10-CM | POA: Diagnosis present

## 2015-11-19 DIAGNOSIS — G8929 Other chronic pain: Secondary | ICD-10-CM | POA: Diagnosis present

## 2015-11-19 DIAGNOSIS — Z833 Family history of diabetes mellitus: Secondary | ICD-10-CM

## 2015-11-19 DIAGNOSIS — M5117 Intervertebral disc disorders with radiculopathy, lumbosacral region: Secondary | ICD-10-CM | POA: Diagnosis present

## 2015-11-19 DIAGNOSIS — M48061 Spinal stenosis, lumbar region without neurogenic claudication: Secondary | ICD-10-CM | POA: Diagnosis present

## 2015-11-19 DIAGNOSIS — F410 Panic disorder [episodic paroxysmal anxiety] without agoraphobia: Secondary | ICD-10-CM | POA: Diagnosis present

## 2015-11-19 DIAGNOSIS — Z8249 Family history of ischemic heart disease and other diseases of the circulatory system: Secondary | ICD-10-CM

## 2015-11-19 DIAGNOSIS — Z820 Family history of epilepsy and other diseases of the nervous system: Secondary | ICD-10-CM

## 2015-11-19 DIAGNOSIS — M79605 Pain in left leg: Secondary | ICD-10-CM | POA: Diagnosis present

## 2015-11-19 DIAGNOSIS — Z823 Family history of stroke: Secondary | ICD-10-CM

## 2015-11-19 HISTORY — PX: LUMBAR LAMINECTOMY/DECOMPRESSION MICRODISCECTOMY: SHX5026

## 2015-11-19 SURGERY — LUMBAR LAMINECTOMY/DECOMPRESSION MICRODISCECTOMY 1 LEVEL
Anesthesia: General | Site: Spine Lumbar | Laterality: Left

## 2015-11-19 MED ORDER — AMPHETAMINE-DEXTROAMPHETAMINE 10 MG PO TABS
10.0000 mg | ORAL_TABLET | Freq: Three times a day (TID) | ORAL | Status: DC
  Administered 2015-11-19 – 2015-11-20 (×2): 10 mg via ORAL
  Filled 2015-11-19 (×2): qty 1

## 2015-11-19 MED ORDER — LOPERAMIDE HCL 2 MG PO CAPS
2.0000 mg | ORAL_CAPSULE | Freq: Four times a day (QID) | ORAL | Status: DC | PRN
  Filled 2015-11-19: qty 1

## 2015-11-19 MED ORDER — LACTATED RINGERS IV SOLN
INTRAVENOUS | Status: DC | PRN
  Administered 2015-11-19 (×2): via INTRAVENOUS

## 2015-11-19 MED ORDER — DIAZEPAM 5 MG PO TABS
5.0000 mg | ORAL_TABLET | Freq: Four times a day (QID) | ORAL | Status: DC | PRN
  Administered 2015-11-19 – 2015-11-20 (×3): 5 mg via ORAL
  Filled 2015-11-19 (×3): qty 1

## 2015-11-19 MED ORDER — MEPERIDINE HCL 25 MG/ML IJ SOLN: 6.2500 mg | INTRAMUSCULAR | Status: DC | PRN

## 2015-11-19 MED ORDER — LIDOCAINE HCL (CARDIAC) 20 MG/ML IV SOLN
INTRAVENOUS | Status: AC
  Filled 2015-11-19: qty 5

## 2015-11-19 MED ORDER — THROMBIN 5000 UNITS EX SOLR
CUTANEOUS | Status: DC | PRN
  Administered 2015-11-19 (×2): 5000 [IU] via TOPICAL

## 2015-11-19 MED ORDER — ONDANSETRON HCL 4 MG/2ML IJ SOLN
INTRAMUSCULAR | Status: AC
  Filled 2015-11-19: qty 2

## 2015-11-19 MED ORDER — PHENOL 1.4 % MT LIQD: 1.0000 | OROMUCOSAL | Status: DC | PRN

## 2015-11-19 MED ORDER — ACETAMINOPHEN 325 MG PO TABS: 650.0000 mg | ORAL_TABLET | ORAL | Status: DC | PRN

## 2015-11-19 MED ORDER — SUCCINYLCHOLINE CHLORIDE 20 MG/ML IJ SOLN
INTRAMUSCULAR | Status: AC
  Filled 2015-11-19: qty 1

## 2015-11-19 MED ORDER — THROMBIN 5000 UNITS EX SOLR
OROMUCOSAL | Status: DC | PRN
  Administered 2015-11-19: 09:00:00 via TOPICAL

## 2015-11-19 MED ORDER — NEOSTIGMINE METHYLSULFATE 10 MG/10ML IV SOLN
INTRAVENOUS | Status: AC
  Filled 2015-11-19: qty 1

## 2015-11-19 MED ORDER — BUPIVACAINE LIPOSOME 1.3 % IJ SUSP
20.0000 mL | INTRAMUSCULAR | Status: AC
  Filled 2015-11-19: qty 20

## 2015-11-19 MED ORDER — PHENYLEPHRINE HCL 10 MG/ML IJ SOLN
INTRAMUSCULAR | Status: DC | PRN
  Administered 2015-11-19 (×3): 40 ug via INTRAVENOUS

## 2015-11-19 MED ORDER — PROMETHAZINE HCL 25 MG/ML IJ SOLN: 6.2500 mg | INTRAMUSCULAR | Status: DC | PRN

## 2015-11-19 MED ORDER — ARTIFICIAL TEARS OP OINT
TOPICAL_OINTMENT | OPHTHALMIC | Status: DC | PRN
  Administered 2015-11-19: 1 via OPHTHALMIC

## 2015-11-19 MED ORDER — SODIUM CHLORIDE 0.9 % IV SOLN
INTRAVENOUS | Status: DC
  Administered 2015-11-19: 12:00:00 via INTRAVENOUS

## 2015-11-19 MED ORDER — ROCURONIUM BROMIDE 50 MG/5ML IV SOLN
INTRAVENOUS | Status: AC
  Filled 2015-11-19: qty 1

## 2015-11-19 MED ORDER — FENTANYL CITRATE (PF) 100 MCG/2ML IJ SOLN
INTRAMUSCULAR | Status: DC | PRN
  Administered 2015-11-19 (×4): 50 ug via INTRAVENOUS
  Administered 2015-11-19: 250 ug via INTRAVENOUS
  Administered 2015-11-19: 50 ug via INTRAVENOUS

## 2015-11-19 MED ORDER — ONDANSETRON HCL 4 MG/2ML IJ SOLN: 4.0000 mg | INTRAMUSCULAR | Status: DC | PRN

## 2015-11-19 MED ORDER — FENTANYL CITRATE (PF) 250 MCG/5ML IJ SOLN
INTRAMUSCULAR | Status: AC
  Filled 2015-11-19: qty 5

## 2015-11-19 MED ORDER — CEFAZOLIN SODIUM 1-5 GM-% IV SOLN
1.0000 g | Freq: Three times a day (TID) | INTRAVENOUS | Status: AC
  Administered 2015-11-19 (×2): 1 g via INTRAVENOUS
  Filled 2015-11-19 (×2): qty 50

## 2015-11-19 MED ORDER — ACETAMINOPHEN 650 MG RE SUPP: 650.0000 mg | RECTAL | Status: DC | PRN

## 2015-11-19 MED ORDER — NEOSTIGMINE METHYLSULFATE 10 MG/10ML IV SOLN
INTRAVENOUS | Status: DC | PRN
  Administered 2015-11-19: 3 mg via INTRAVENOUS

## 2015-11-19 MED ORDER — MIDAZOLAM HCL 2 MG/2ML IJ SOLN: 0.5000 mg | Freq: Once | INTRAMUSCULAR | Status: DC | PRN

## 2015-11-19 MED ORDER — HYDROMORPHONE HCL 1 MG/ML IJ SOLN
0.2500 mg | INTRAMUSCULAR | Status: DC | PRN
  Administered 2015-11-19 (×2): 0.5 mg via INTRAVENOUS

## 2015-11-19 MED ORDER — LACTATED RINGERS IV SOLN
INTRAVENOUS | Status: DC
  Administered 2015-11-19: 07:00:00 via INTRAVENOUS

## 2015-11-19 MED ORDER — PHENYLEPHRINE 40 MCG/ML (10ML) SYRINGE FOR IV PUSH (FOR BLOOD PRESSURE SUPPORT)
PREFILLED_SYRINGE | INTRAVENOUS | Status: AC
  Filled 2015-11-19: qty 10

## 2015-11-19 MED ORDER — SODIUM CHLORIDE 0.9 % IJ SOLN: 3.0000 mL | INTRAMUSCULAR | Status: DC | PRN

## 2015-11-19 MED ORDER — ROCURONIUM BROMIDE 100 MG/10ML IV SOLN
INTRAVENOUS | Status: DC | PRN
  Administered 2015-11-19: 50 mg via INTRAVENOUS

## 2015-11-19 MED ORDER — ZOLPIDEM TARTRATE 5 MG PO TABS: 5.0000 mg | ORAL_TABLET | Freq: Every evening | ORAL | Status: DC | PRN

## 2015-11-19 MED ORDER — ARTIFICIAL TEARS OP OINT
TOPICAL_OINTMENT | OPHTHALMIC | Status: AC
  Filled 2015-11-19: qty 3.5

## 2015-11-19 MED ORDER — OXYCODONE-ACETAMINOPHEN 5-325 MG PO TABS
2.0000 | ORAL_TABLET | ORAL | Status: DC | PRN
  Administered 2015-11-19 – 2015-11-20 (×6): 2 via ORAL
  Filled 2015-11-19 (×6): qty 2

## 2015-11-19 MED ORDER — GLYCOPYRROLATE 0.2 MG/ML IJ SOLN
INTRAMUSCULAR | Status: DC | PRN
  Administered 2015-11-19: 0.4 mg via INTRAVENOUS

## 2015-11-19 MED ORDER — 0.9 % SODIUM CHLORIDE (POUR BTL) OPTIME
TOPICAL | Status: DC | PRN
  Administered 2015-11-19: 1000 mL

## 2015-11-19 MED ORDER — MORPHINE SULFATE (PF) 2 MG/ML IV SOLN
1.0000 mg | INTRAVENOUS | Status: DC | PRN
  Administered 2015-11-20: 4 mg via INTRAVENOUS
  Filled 2015-11-19: qty 2

## 2015-11-19 MED ORDER — GABAPENTIN 300 MG PO CAPS
300.0000 mg | ORAL_CAPSULE | Freq: Two times a day (BID) | ORAL | Status: DC
  Administered 2015-11-19 – 2015-11-20 (×2): 300 mg via ORAL
  Filled 2015-11-19 (×2): qty 1

## 2015-11-19 MED ORDER — PROPOFOL 10 MG/ML IV BOLUS
INTRAVENOUS | Status: AC
  Filled 2015-11-19: qty 20

## 2015-11-19 MED ORDER — HEMOSTATIC AGENTS (NO CHARGE) OPTIME
TOPICAL | Status: DC | PRN
  Administered 2015-11-19: 1 via TOPICAL

## 2015-11-19 MED ORDER — DICLOFENAC SODIUM 75 MG PO TBEC: 75.0000 mg | DELAYED_RELEASE_TABLET | Freq: Two times a day (BID) | ORAL | Status: DC

## 2015-11-19 MED ORDER — PROPOFOL 10 MG/ML IV BOLUS
INTRAVENOUS | Status: DC | PRN
  Administered 2015-11-19: 30 mg via INTRAVENOUS
  Administered 2015-11-19: 120 mg via INTRAVENOUS

## 2015-11-19 MED ORDER — MENTHOL 3 MG MT LOZG: 1.0000 | LOZENGE | OROMUCOSAL | Status: DC | PRN

## 2015-11-19 MED ORDER — SODIUM CHLORIDE 0.9 % IJ SOLN
3.0000 mL | Freq: Two times a day (BID) | INTRAMUSCULAR | Status: DC
  Administered 2015-11-19 (×2): 3 mL via INTRAVENOUS

## 2015-11-19 MED ORDER — BUPIVACAINE LIPOSOME 1.3 % IJ SUSP
INTRAMUSCULAR | Status: DC | PRN
  Administered 2015-11-19: 20 mL

## 2015-11-19 MED ORDER — HYDROMORPHONE HCL 1 MG/ML IJ SOLN
INTRAMUSCULAR | Status: AC
  Filled 2015-11-19: qty 1

## 2015-11-19 MED ORDER — MIDAZOLAM HCL 2 MG/2ML IJ SOLN
INTRAMUSCULAR | Status: AC
  Filled 2015-11-19: qty 2

## 2015-11-19 MED ORDER — ONDANSETRON HCL 4 MG/2ML IJ SOLN
INTRAMUSCULAR | Status: DC | PRN
  Administered 2015-11-19: 4 mg via INTRAVENOUS

## 2015-11-19 MED ORDER — POTASSIUM CHLORIDE ER 10 MEQ PO TBCR
10.0000 meq | EXTENDED_RELEASE_TABLET | Freq: Two times a day (BID) | ORAL | Status: DC
  Administered 2015-11-19 – 2015-11-20 (×2): 10 meq via ORAL
  Filled 2015-11-19 (×4): qty 1

## 2015-11-19 MED ORDER — SENNA 8.6 MG PO TABS
1.0000 | ORAL_TABLET | Freq: Two times a day (BID) | ORAL | Status: DC
  Administered 2015-11-19 – 2015-11-20 (×3): 8.6 mg via ORAL
  Filled 2015-11-19 (×3): qty 1

## 2015-11-19 MED ORDER — MIDAZOLAM HCL 5 MG/5ML IJ SOLN
INTRAMUSCULAR | Status: DC | PRN
  Administered 2015-11-19: 2 mg via INTRAVENOUS

## 2015-11-19 MED ORDER — GLYCOPYRROLATE 0.2 MG/ML IJ SOLN
INTRAMUSCULAR | Status: AC
  Filled 2015-11-19: qty 2

## 2015-11-19 MED ORDER — CLONAZEPAM 0.5 MG PO TABS
0.5000 mg | ORAL_TABLET | Freq: Two times a day (BID) | ORAL | Status: DC
  Administered 2015-11-20: 0.5 mg via ORAL
  Filled 2015-11-19: qty 1

## 2015-11-19 SURGICAL SUPPLY — 67 items
ADH SKN CLS APL DERMABOND .7 (GAUZE/BANDAGES/DRESSINGS) ×1
APL SKNCLS STERI-STRIP NONHPOA (GAUZE/BANDAGES/DRESSINGS) ×1
BENZOIN TINCTURE PRP APPL 2/3 (GAUZE/BANDAGES/DRESSINGS) ×3 IMPLANT
BLADE CLIPPER SURG (BLADE) IMPLANT
BUR ACORN 6.0 (BURR) ×2 IMPLANT
BUR ACORN 6.0MM (BURR) ×1
BUR MATCHSTICK NEURO 3.0 LAGG (BURR) IMPLANT
CANISTER SUCT 3000ML PPV (MISCELLANEOUS) ×1 IMPLANT
CLOSURE WOUND 1/2 X4 (GAUZE/BANDAGES/DRESSINGS)
DERMABOND ADVANCED (GAUZE/BANDAGES/DRESSINGS) ×2
DERMABOND ADVANCED .7 DNX12 (GAUZE/BANDAGES/DRESSINGS) IMPLANT
DRAPE LAPAROTOMY 100X72X124 (DRAPES) ×3 IMPLANT
DRAPE MICROSCOPE LEICA (MISCELLANEOUS) ×3 IMPLANT
DRAPE POUCH INSTRU U-SHP 10X18 (DRAPES) ×3 IMPLANT
DRAPE PROXIMA HALF (DRAPES) ×4 IMPLANT
DRSG PAD ABDOMINAL 8X10 ST (GAUZE/BANDAGES/DRESSINGS) IMPLANT
DURAPREP 26ML APPLICATOR (WOUND CARE) ×3 IMPLANT
ELECT BLADE 4.0 EZ CLEAN MEGAD (MISCELLANEOUS) ×3
ELECT REM PT RETURN 9FT ADLT (ELECTROSURGICAL) ×3
ELECTRODE BLDE 4.0 EZ CLN MEGD (MISCELLANEOUS) IMPLANT
ELECTRODE REM PT RTRN 9FT ADLT (ELECTROSURGICAL) ×1 IMPLANT
GAUZE SPONGE 4X4 12PLY STRL (GAUZE/BANDAGES/DRESSINGS) ×3 IMPLANT
GAUZE SPONGE 4X4 16PLY XRAY LF (GAUZE/BANDAGES/DRESSINGS) IMPLANT
GLOVE BIOGEL M 8.0 STRL (GLOVE) ×3 IMPLANT
GLOVE BIOGEL PI IND STRL 7.5 (GLOVE) IMPLANT
GLOVE BIOGEL PI INDICATOR 7.5 (GLOVE) ×2
GLOVE ECLIPSE 9.0 STRL (GLOVE) ×2 IMPLANT
GLOVE EXAM NITRILE LRG STRL (GLOVE) IMPLANT
GLOVE EXAM NITRILE MD LF STRL (GLOVE) IMPLANT
GLOVE EXAM NITRILE XL STR (GLOVE) IMPLANT
GLOVE EXAM NITRILE XS STR PU (GLOVE) IMPLANT
GLOVE SS BIOGEL STRL SZ 7 (GLOVE) IMPLANT
GLOVE SUPERSENSE BIOGEL SZ 7 (GLOVE) ×2
GOWN STRL REUS W/ TWL LRG LVL3 (GOWN DISPOSABLE) ×1 IMPLANT
GOWN STRL REUS W/ TWL XL LVL3 (GOWN DISPOSABLE) IMPLANT
GOWN STRL REUS W/TWL 2XL LVL3 (GOWN DISPOSABLE) IMPLANT
GOWN STRL REUS W/TWL LRG LVL3 (GOWN DISPOSABLE) ×6
GOWN STRL REUS W/TWL XL LVL3 (GOWN DISPOSABLE) ×6
HEMOSTAT POWDER SURGIFOAM 1G (HEMOSTASIS) ×2 IMPLANT
KIT BASIN OR (CUSTOM PROCEDURE TRAY) ×3 IMPLANT
KIT ROOM TURNOVER OR (KITS) ×3 IMPLANT
NDL HYPO 18GX1.5 BLUNT FILL (NEEDLE) IMPLANT
NDL HYPO 21X1.5 SAFETY (NEEDLE) IMPLANT
NDL HYPO 25X1 1.5 SAFETY (NEEDLE) IMPLANT
NDL SPNL 20GX3.5 QUINCKE YW (NEEDLE) IMPLANT
NEEDLE HYPO 18GX1.5 BLUNT FILL (NEEDLE) IMPLANT
NEEDLE HYPO 21X1.5 SAFETY (NEEDLE) ×3 IMPLANT
NEEDLE HYPO 25X1 1.5 SAFETY (NEEDLE) IMPLANT
NEEDLE SPNL 20GX3.5 QUINCKE YW (NEEDLE) ×3 IMPLANT
NS IRRIG 1000ML POUR BTL (IV SOLUTION) ×3 IMPLANT
PACK LAMINECTOMY NEURO (CUSTOM PROCEDURE TRAY) ×3 IMPLANT
PAD ARMBOARD 7.5X6 YLW CONV (MISCELLANEOUS) ×9 IMPLANT
PATTIES SURGICAL .5 X1 (DISPOSABLE) ×1 IMPLANT
RUBBERBAND STERILE (MISCELLANEOUS) ×6 IMPLANT
SPONGE LAP 4X18 X RAY DECT (DISPOSABLE) IMPLANT
SPONGE SURGIFOAM ABS GEL SZ50 (HEMOSTASIS) ×3 IMPLANT
STAPLER VISISTAT 35W (STAPLE) ×2 IMPLANT
STRIP CLOSURE SKIN 1/2X4 (GAUZE/BANDAGES/DRESSINGS) ×1 IMPLANT
SUT PROLENE 6 0 BV (SUTURE) ×2 IMPLANT
SUT VIC AB 0 CT1 18XCR BRD8 (SUTURE) ×1 IMPLANT
SUT VIC AB 0 CT1 8-18 (SUTURE) ×3
SUT VIC AB 2-0 CP2 18 (SUTURE) ×3 IMPLANT
SUT VIC AB 3-0 SH 8-18 (SUTURE) ×3 IMPLANT
SYR 5ML LL (SYRINGE) IMPLANT
TOWEL OR 17X24 6PK STRL BLUE (TOWEL DISPOSABLE) ×3 IMPLANT
TOWEL OR 17X26 10 PK STRL BLUE (TOWEL DISPOSABLE) ×3 IMPLANT
WATER STERILE IRR 1000ML POUR (IV SOLUTION) ×3 IMPLANT

## 2015-11-19 NOTE — Anesthesia Postprocedure Evaluation (Signed)
Anesthesia Post Note  Patient: Katie Brady  Procedure(s) Performed: Procedure(s) (LRB): Left Lumbar Five-Sacral One Microdiskectomy (Left)  Patient location during evaluation: PACU Anesthesia Type: General Level of consciousness: awake and alert, patient cooperative and oriented Pain management: pain level controlled Vital Signs Assessment: post-procedure vital signs reviewed and stable Respiratory status: spontaneous breathing, nonlabored ventilation, respiratory function stable and patient connected to nasal cannula oxygen Cardiovascular status: blood pressure returned to baseline and stable Postop Assessment: no signs of nausea or vomiting Anesthetic complications: no    Last Vitals:  Filed Vitals:   11/19/15 1057 11/19/15 1120  BP: 125/82 106/69  Pulse: 90 79  Temp: 36.7 C 36.5 C  Resp: 14 16    Last Pain:  Filed Vitals:   11/19/15 1140  PainSc: 3                  Prachi Oftedahl,E. Thy Gullikson

## 2015-11-19 NOTE — Anesthesia Procedure Notes (Signed)
Procedure Name: Intubation Date/Time: 11/19/2015 8:11 AM Performed by: Garrison Columbus T Pre-anesthesia Checklist: Patient identified, Emergency Drugs available, Suction available and Patient being monitored Patient Re-evaluated:Patient Re-evaluated prior to inductionOxygen Delivery Method: Circle system utilized Preoxygenation: Pre-oxygenation with 100% oxygen Intubation Type: IV induction Ventilation: Mask ventilation without difficulty Laryngoscope Size: Miller and 2 Grade View: Grade I Tube type: Oral Tube size: 7.5 mm Number of attempts: 1 Airway Equipment and Method: Stylet Placement Confirmation: ETT inserted through vocal cords under direct vision,  positive ETCO2 and breath sounds checked- equal and bilateral Secured at: 22 cm Tube secured with: Tape Dental Injury: Teeth and Oropharynx as per pre-operative assessment

## 2015-11-19 NOTE — Op Note (Signed)
[name], Katie Brady                 ACCOUNT NO.:  [id number]  MEDICAL RECORD NO.:  [id number]  LOCATION:  [facility]                         FACILITY:  [facility]  PHYSICIAN:  Leeroy Cha, M.D.   DATE OF BIRTH:  10/22/1971  DATE OF PROCEDURE:  11/19/2015 DATE OF DISCHARGE:                              OPERATIVE REPORT   PREOPERATIVE DIAGNOSES:  Left L5-S1 chronic radiculopathy.  Chronic back pain.  POSTOPERATIVE DIAGNOSES:  Left L5-S1 chronic radiculopathy.  Chronic back pain.  PROCEDURE:  Left L5-S1 laminotomy.  Decompression of the L5-S1 nerve root.  Repair of durotomy.  Microscope.  SURGEON:  Leeroy Cha, M.D.  ASSISTANT:  Cooper Render. Pool, M.D.  CLINICAL HISTORY:  The patient had been seen in my office for many months complaining of back pain radiation to the left leg.  She had been having more pain lately.  She had been postponed the surgery because she is taking care of her grandson.  Finally, she came to the office, being unable to walk because of the pain.  X-ray shows some degenerative disk disease, bilateral at L5-S1.  The patient had no complaint in the right leg.  Benefits and risks were fully explained.  DESCRIPTION OF PROCEDURE:  The patient was taken to the OR, and after intubation, she was positioned in prone manner.  The patient had quite a bit of hyperlordosis and it was quite difficult to feel any bony structure.  Nevertheless, we did a midline incision through the skin and subcutaneous tissue down to the spine.  We retracted laterally.  First, x-ray showed that we were at the L4-5 and from then on, we did x-ray, we localized 5-1.  The patient had quite a bit of fibrosis, lysis was accomplished.  At the end, we were able to separate the soft tissue, went from the lamina and the spinous process.  It was difficult to find a plane and we started drilling until we were able to get the thecal sac.  Once we saw the thecal sac, we started using the 1 and 2-mm Kerrison  punch to decompress proximal and distally.  Decompression of the L5 nerve root was done as well as the S1.  There was a small opening in the dura, which was taken care with a single stitch of Prolene. Valsalva up to 40 was negative.  From then on, we investigated midline into the L5 nerve root and there was plenty of space.  The area was irrigated.  The wound was closed with different layer of Vicryl and then staples and Dermabond.  The patient is going back to the floor.          ______________________________ Leeroy Cha, M.D.    EB/MEDQ  D:  11/19/2015  T:  11/19/2015  Job:  [id number]

## 2015-11-19 NOTE — Anesthesia Preprocedure Evaluation (Addendum)
Anesthesia Evaluation  Patient identified by MRN, date of birth, ID band Patient awake    Reviewed: Allergy & Precautions, NPO status , Patient's Chart, lab work & pertinent test results  History of Anesthesia Complications Negative for: history of anesthetic complications  Airway Mallampati: I  TM Distance: >3 FB Neck ROM: Full    Dental  (+) Dental Advisory Given, Edentulous Upper, Edentulous Lower   Pulmonary Current Smoker,    breath sounds clear to auscultation       Cardiovascular negative cardio ROS   Rhythm:Regular Rate:Normal     Neuro/Psych  Headaches, Seizures -, Well Controlled,  PSYCHIATRIC DISORDERS Anxiety Depression Chronic back pain: narcotics    GI/Hepatic Neg liver ROS, GERD  Medicated and Controlled,  Endo/Other  negative endocrine ROS  Renal/GU negative Renal ROS     Musculoskeletal  (+) Arthritis ,   Abdominal   Peds  Hematology negative hematology ROS (+)   Anesthesia Other Findings   Reproductive/Obstetrics                          Anesthesia Physical Anesthesia Plan  ASA: II  Anesthesia Plan: General   Post-op Pain Management:    Induction: Intravenous  Airway Management Planned: Oral ETT  Additional Equipment:   Intra-op Plan:   Post-operative Plan: Extubation in OR  Informed Consent: I have reviewed the patients History and Physical, chart, labs and discussed the procedure including the risks, benefits and alternatives for the proposed anesthesia with the patient or authorized representative who has indicated his/her understanding and acceptance.   Dental advisory given  Plan Discussed with: CRNA, Anesthesiologist and Surgeon  Anesthesia Plan Comments: (Plan routine monitors, GETA)       Anesthesia Quick Evaluation

## 2015-11-19 NOTE — Transfer of Care (Signed)
Immediate Anesthesia Transfer of Care Note  Patient: Katie Brady  Procedure(s) Performed: Procedure(s) with comments: Left Lumbar Five-Sacral One Microdiskectomy (Left) - Left L5-S1 Microdiskectomy  Patient Location: PACU  Anesthesia Type:General  Level of Consciousness: awake, alert  and oriented  Airway & Oxygen Therapy: Patient Spontanous Breathing and Patient connected to nasal cannula oxygen  Post-op Assessment: Report given to RN, Post -op Vital signs reviewed and stable and Patient moving all extremities X 4  Post vital signs: Reviewed and stable  Last Vitals:  Filed Vitals:   11/19/15 0723  BP: 128/69  Pulse: 97  Temp: 36.8 C  Resp: 20    Complications: No apparent anesthesia complications

## 2015-11-20 ENCOUNTER — Encounter (HOSPITAL_COMMUNITY): Payer: Self-pay | Admitting: Neurosurgery

## 2015-11-20 NOTE — Discharge Summary (Signed)
Physician Discharge Summary  Patient ID: Katie Brady MRN: [id number] DOB/AGE: 47/24/1972 47 y.o.  Admit date: 11/19/2015 Discharge date: 11/20/2015  Admission Diagnoses:lumbar foraminal stenosis  Discharge Diagnoses:  Active Problems:   Lumbar foraminal stenosis   Discharged [facility] Course: surgery  Consults: none  Significant Diagnostic Studies: myelogram  Treatments:lumbar decompression  Discharge Exam: Blood pressure 101/73, pulse 88, temperature 98.1 F (36.7 C), temperature source Oral, resp. rate 16, weight 76.204 kg (168 lb), SpO2 100 %. No weakness  Disposition: home     Medication List    ASK your doctor about these medications        amphetamine-dextroamphetamine 10 MG tablet  Commonly known as:  ADDERALL  Take 10 mg by mouth 3 (three) times daily.     clonazePAM 0.5 MG tablet  Commonly known as:  KLONOPIN  Take 1 mg by mouth 3 (three) times daily as needed for anxiety.     cyclobenzaprine 10 MG tablet  Commonly known as:  FLEXERIL  Take 1 tablet (10 mg total) by mouth 3 (three) times daily.     dexamethasone 6 MG tablet  Commonly known as:  DECADRON  1 po bid with food     diclofenac 75 MG EC tablet  Commonly known as:  VOLTAREN  Take 1 tablet (75 mg total) by mouth 2 (two) times daily.     gabapentin 300 MG capsule  Commonly known as:  NEURONTIN  Take 1 capsule (300 mg total) by mouth 2 (two) times daily.     HYDROcodone-acetaminophen 5-325 MG tablet  Commonly known as:  NORCO/VICODIN  Take 1-2 tablets by mouth every 6 (six) hours as needed.     loperamide 2 MG tablet  Commonly known as:  IMODIUM A-D  Take 1 tablet (2 mg total) by mouth 4 (four) times daily as needed for diarrhea or loose stools.     omeprazole 20 MG capsule  Commonly known as:  PRILOSEC  Take 20 mg by mouth daily.     ondansetron 4 MG disintegrating tablet  Commonly known as:  ZOFRAN ODT  Take 1 tablet (4 mg total) by mouth every 8 (eight)  hours as needed.     oxyCODONE-acetaminophen 5-325 MG tablet  Commonly known as:  PERCOCET/ROXICET  Take 1 tablet by mouth every 6 (six) hours as needed for severe pain.     potassium chloride 10 MEQ tablet  Commonly known as:  K-DUR  Take 1 tablet (10 mEq total) by mouth 2 (two) times daily.     traMADol 50 MG tablet  Commonly known as:  ULTRAM  Take 1 tablet (50 mg total) by mouth every 6 (six) hours as needed.         Signed: Floyce Stakes 11/20/2015, 8:45 AM

## 2015-11-20 NOTE — Discharge Instructions (Signed)
Wound Care °Leave incision open to air. °You may shower. °Do not scrub directly on incision.  °Leave steri-strips on incision.  They will fall off by themselves. °Do not put any creams, lotions, or ointments on incision. °Activity °Walk each and every day, increasing distance each day. °No lifting greater than 5 lbs.  Avoid bending, arching, and twisting. °No driving for 2 weeks; may ride as a passenger locally. °If provided with back brace, wear when out of bed.  It is not necessary to wear in bed. °Diet °Resume your normal diet.  °Return to Work °Will be discussed at you follow up appointment. °Call Your Doctor If Any of These Occur °Redness, drainage, or swelling at the wound.  °Temperature greater than 101 degrees. °Severe pain not relieved by pain medication. °Incision starts to come apart. °Follow Up Appt °Call today for appointment in 3-4 weeks ([phone]) or for problems.  If you have any hardware placed in your spine, you will need an x-ray before your appointment. ° °

## 2015-11-20 NOTE — Evaluation (Signed)
Physical Therapy Evaluation and Discharge Patient Details Name: Katie Brady MRN: [id number] DOB: [birth year] Today's Date: 11/20/2015   History of Present Illness  Pt is a 45 y/o female s/p L5-S1 Laminectomy with decompression.secondary to lumbar foraminal stenosis. DOS: 11/20/15  Clinical Impression  Patient evaluated by Physical Therapy with no further acute PT needs identified. All education has been completed and the patient has no further questions. At the time of PT eval pt was able to perform transfers and ambulation at a mod I level. Pt was instructed in stair training and was able to complete with min guard assist for safety. See below for any follow-up Physical Therapy or equipment needs. PT is signing off. Thank you for this referral.     Follow Up Recommendations Outpatient PT;Supervision for mobility/OOB    Equipment Recommendations  None recommended by PT    Recommendations for Other Services       Precautions / Restrictions Precautions Precautions: Back;Fall Precaution Booklet Issued: Yes (comment) Precaution Comments: Reviewed handout and discussed during functional mobility Required Braces or Orthoses:  (Brace in nursing orders but per RN no brace needed) Restrictions Weight Bearing Restrictions: No      Mobility  Bed Mobility Overal bed mobility: Modified Independent             General bed mobility comments: Increased time. Pt already in S/L upon PT arrival. Discussed proper log roll technique. Pt was able to transition to full sitting without assist (however use of rails).  Transfers Overall transfer level: Needs assistance Equipment used: None Transfers: Sit to/from Stand Sit to Stand: Supervision;Modified independent (Device/Increase time) Stand pivot transfers: Supervision       General transfer comment: Supervision initially as 1st time OOB with therapy, however progressed to mod I by end of session.   Ambulation/Gait Ambulation/Gait  assistance: Modified independent (Device/Increase time) Ambulation Distance (Feet): 300 Feet Assistive device: None Gait Pattern/deviations: Step-through pattern;Decreased stride length;Trunk flexed Gait velocity: Decreased Gait velocity interpretation: Below normal speed for age/gender General Gait Details: Slow but steady gait. Pt did not report increased pain.  Stairs Stairs: Yes Stairs assistance: Min guard Stair Management: Two rails;Step to pattern;Forwards Number of Stairs: 6 General stair comments: VC's for sequencing and technique.   Wheelchair Mobility    Modified Rankin (Stroke Patients Only)       Balance Overall balance assessment: No apparent balance deficits (not formally assessed)                                           Pertinent Vitals/Pain Pain Assessment: 0-10 Pain Score: 6  Pain Location: Incision site Pain Descriptors / Indicators: Operative site guarding;Discomfort Pain Intervention(s): Limited activity within patient's tolerance;Monitored during session;Repositioned    Home Living Family/patient expects to be discharged to:: Private residence Living Arrangements: Spouse/significant other;Other relatives Available Help at Discharge: Family;Available 24 hours/day Type of Home: House Home Access: Stairs to enter Entrance Stairs-Rails: Right;Left;Can reach both Entrance Stairs-Number of Steps: 2 Home Layout: One level Home Equipment: None Additional Comments: Pt states that she can borrow shower chair possibly AE from family and friends if needed.    Prior Function Level of Independence: Independent               Hand Dominance   Dominant Hand: Right    Extremity/Trunk Assessment   Upper Extremity Assessment: Defer to OT evaluation  Lower Extremity Assessment: Overall WFL for tasks assessed      Cervical / Trunk Assessment: Other exceptions  Communication   Communication: No difficulties   Cognition Arousal/Alertness: Awake/alert Behavior During Therapy: WFL for tasks assessed/performed Overall Cognitive Status: Within Functional Limits for tasks assessed                      General Comments      Exercises        Assessment/Plan    PT Assessment Patent does not need any further PT services  PT Diagnosis Difficulty walking;Acute pain   PT Problem List    PT Treatment Interventions     PT Goals (Current goals can be found in the Care Plan section) Acute Rehab PT Goals Patient Stated Goal: Decreased pain, Go home PT Goal Formulation: With patient Time For Goal Achievement: 11/27/15 Potential to Achieve Goals: Good    Frequency     Barriers to discharge        Co-evaluation               End of Session   Activity Tolerance: Patient tolerated treatment well Patient left: in chair;with call bell/phone within reach Nurse Communication: Mobility status;Patient requests pain meds         Time: 0729-0746 PT Time Calculation (min) (ACUTE ONLY): 17 min   Charges:   PT Evaluation $PT Eval Moderate Complexity: 1 Procedure     PT G Codes:        Rolinda Roan 2015/12/11, 8:50 AM   Rolinda Roan, PT, DPT Acute Rehabilitation Services Pager: [phone]

## 2015-11-20 NOTE — Evaluation (Signed)
Occupational Therapy Evaluation Patient Details Name: Katie Brady MRN: [id number] DOB: 22-Jun-1971 Today's Date: 11/20/2015    History of Present Illness Pt is a 45 y/o female s/p L5-S1 Laminectomy with decompression.secondary to lumbar foraminal stenosis. DOS: 11/20/15   Clinical Impression   Pt admitted as above. She is currently supervision-min A for functional mobility related to ADL's and LB ADL's. She will have PRN 24/7 assistance from her husband at d/c and states that she may borrow DME/AE from family members if needed. Reviewed back precautions related to bathing, dressing and toileting. No other needs identified at this time, will sign off acute OT.    Follow Up Recommendations  No OT follow up;Supervision - Intermittent    Equipment Recommendations  Other (comment) (Pt states that she can borrow DME & AE if needed , husband available 24/7 to assist PRN)    Recommendations for Other Services       Precautions / Restrictions Precautions Precautions: Back;Fall Restrictions Weight Bearing Restrictions: No      Mobility Bed Mobility               General bed mobility comments:  (Pt up in chair upon OT arrival)  Transfers Overall transfer level: Needs assistance Equipment used: None Transfers: Sit to/from [employer] Transfers Sit to Stand: Supervision Stand pivot transfers: Supervision       General transfer comment: Pt demonstrated good technique and adhering to back precautions during functional mobility and transfers. Moving slowly and cautiously.    Balance Overall balance assessment: No apparent balance deficits (not formally assessed)                                          ADL Overall ADL's : Needs assistance/impaired     Grooming: Wash/dry hands;Standing;Supervision/safety   Upper Body Bathing: Sitting;Modified independent;Cueing for safety;Supervision/ safety;Set up   Lower Body Bathing: Minimal assistance;Sit  to/from stand;Adhering to back precautions   Upper Body Dressing : Set up;Supervision/safety;Sitting;Cueing for safety Upper Body Dressing Details (indicate cue type and reason): Pt donned and doffed bath robe demonstrating good technique Lower Body Dressing: Minimal assistance;Adhering to back precautions;Sit to/from stand Lower Body Dressing Details (indicate cue type and reason): Pt doffed sock using [employer] Reacher in sitting. She reports that she may be able to borrow AE from family if needed but also states that her husband can assist PRN for LB bathing and dressing. Toilet Transfer: Supervision/safety;Ambulation;Comfort height toilet   Toileting- Clothing Manipulation and Hygiene: Supervision/safety;Sit to/from stand;Cueing for back precautions Toileting - Clothing Manipulation Details (indicate cue type and reason): Reviewed techniques and no twisting with pt.   Tub/Shower Transfer Details (indicate cue type and reason): Simulated tub transfer (high stepping) and also did stairs with PT; Pt states that she may borrow shower chair from family member and husband will assist PRN Functional mobility during ADLs: Supervision/safety;Caregiver able to provide necessary level of assistance General ADL Comments: Handout issued and reviewed for back precautions related to bed mobility, showering, dressing and toileting. Pt verbalized understanding and states that husband will be able to assist PRN for ADL's at d/c.     Vision Vision Assessment?: No apparent visual deficits; No change from baseline.   Perception     Praxis      Pertinent Vitals/Pain Pain Assessment: 0-10 Pain Score: 6  Pain Location: Back Pain Descriptors / Indicators: Sore;Aching Pain Intervention(s): Limited activity within  patient's tolerance;Monitored during session;Premedicated before session;Repositioned     Hand Dominance Right   Extremity/Trunk Assessment Upper Extremity Assessment Upper Extremity Assessment:  Overall WFL for tasks assessed   Lower Extremity Assessment Lower Extremity Assessment: Defer to PT evaluation   Cervical / Trunk Assessment Cervical / Trunk Assessment: Normal   Communication Communication Communication: No difficulties   Cognition Arousal/Alertness: Awake/alert Behavior During Therapy: WFL for tasks assessed/performed Overall Cognitive Status: Within Functional Limits for tasks assessed                     General Comments       Exercises       Shoulder Instructions      Home Living Family/patient expects to be discharged to:: Private residence Living Arrangements: Spouse/significant other;Other relatives Available Help at Discharge: Family;Available 24 hours/day Type of Home: House Home Access: Stairs to enter [employer] of Steps: 2 STE         Bathroom Shower/Tub: Teacher, early years/pre: Standard     Home Equipment: None   Additional Comments: Pt states that she can borrow shower chair possibly AE from family and friends if needed.      Prior Functioning/Environment Level of Independence: Independent             OT Diagnosis: Acute pain   OT Problem List:     OT Treatment/Interventions:      OT Goals(Current goals can be found in the care plan section) Acute Rehab OT Goals Patient Stated Goal: Decreased pain, Go home OT Goal Formulation: All assessment and education complete, DC therapy  OT Frequency:     Barriers to D/C:            Co-evaluation              End of Session    Activity Tolerance: Patient tolerated treatment well Patient left: in chair;with call bell/phone within reach   Time: 0742-0800 OT Time Calculation (min): 18 min Charges:  OT General Charges $OT Visit: 1 Procedure OT Evaluation $OT Eval Moderate Complexity: 1 Procedure G-Codes:    Almyra Deforest, OTR/L 11/20/2015, 8:18 AM

## 2015-11-20 NOTE — Progress Notes (Signed)
Patient alert and oriented, mae's well, voiding adequate amount of urine, swallowing without difficulty, c/o mild pain. Patient discharged home with family. Script and discharged instructions given to patient. Patient and family stated understanding of d/c instructions given and has an appointment with MD.   

## 2016-03-01 IMAGING — CT CT L SPINE W/ CM
3 of 8 series · 10 of 33 positions shown, 11 images · IV contrast (omnipaque)
Comparison: MR 05/15/2015

CLINICAL DATA: Fell.  Bilateral leg pain, left greater than right.

EXAM:
LUMBAR MYELOGRAM
CT LUMBAR SPINE WITH INTRATHECAL CONTRAST
FLUOROSCOPY TIME:  23 seconds
TECHNIQUE: The procedure, risks (including but not limited to bleeding,
infection, organ damage ), benefits, and alternatives were explained
to the patient. Questions regarding the procedure were encouraged
and answered. The patient understands and consents to the procedure.
An appropriate entry site was determined under fluoroscopy. Operator
donned sterile gloves and mask. Skin site was marked, prepped with
Betadine, and draped in usual sterile fashion, and infiltrated
locally with 1% lidocaine. A 22 gauge spinal needle was advanced
into the thecal sac at L4 from a left parasagittal approach. Clear
colorless CSF returned. 17 ml Omnipaque 180 were administered
intrathecally for lumbar myelography, followed by axial CT scanning
of the lumbar spine. I personally performed the lumbar puncture and
administered the intrathecal contrast. I also personally supervised
acquisition of the myelogram images. Coronal and sagittal
reconstructions were generated from the axial scan.

[Series 3: l spine soft · axial · 0.24mm/px · z∈[-235,-127]mm · 2 of 86 slices shown, 3 images]
[im 25/86  soft-tissue]
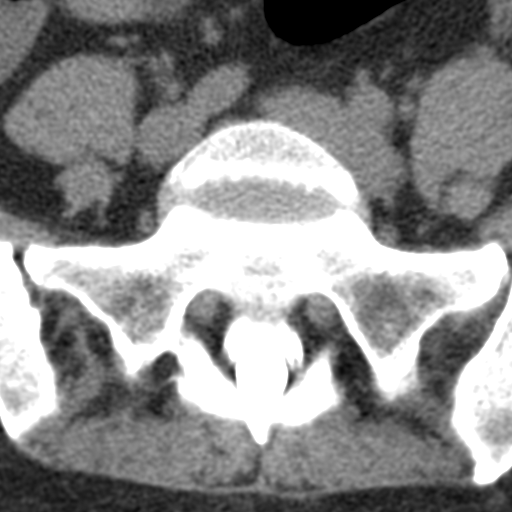
[im 25/86  bone]
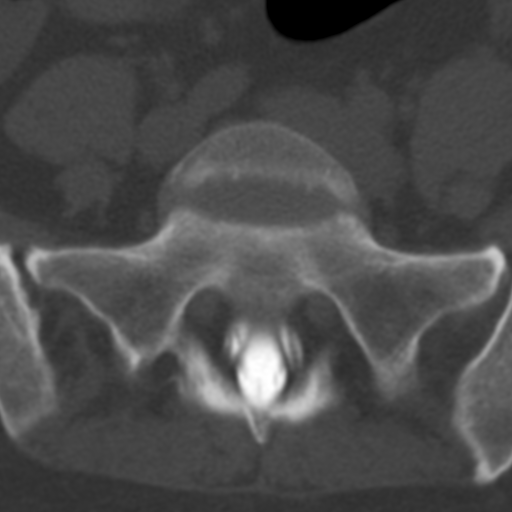
[im 61/86  bone]
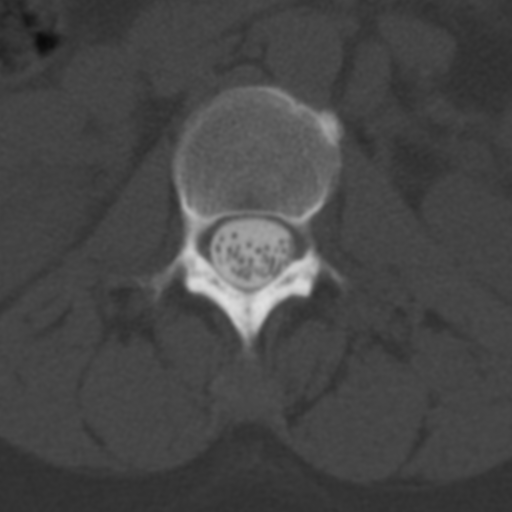

[Series 7: bone cor · coronal · 0.24mm/px · 3 of 31 slices shown]
[im 7/31  bone]
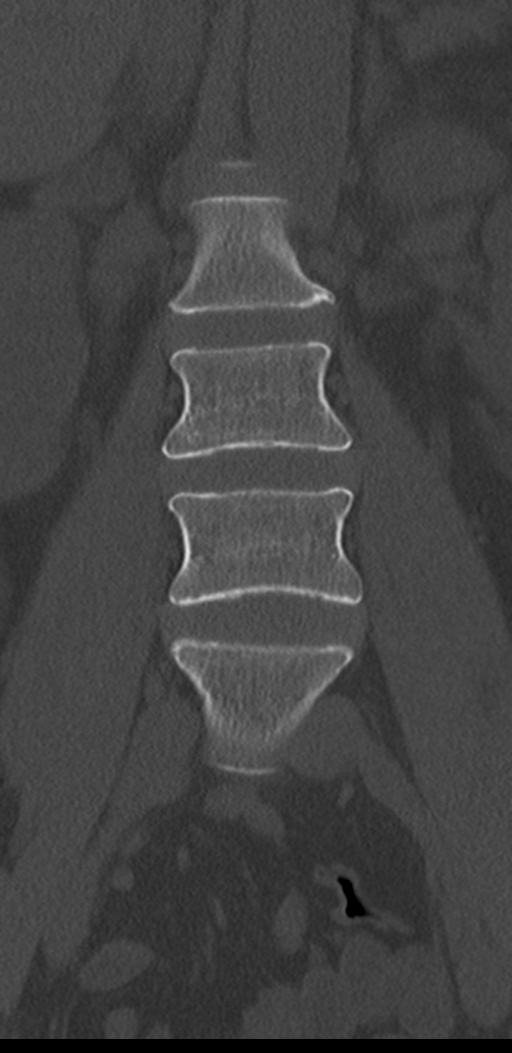
[im 13/31  bone]
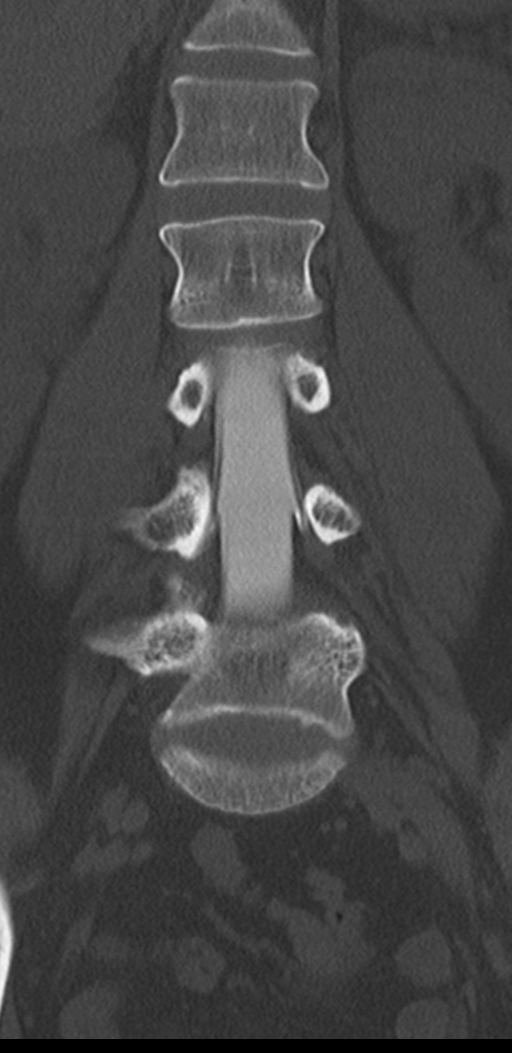
[im 19/31  bone]
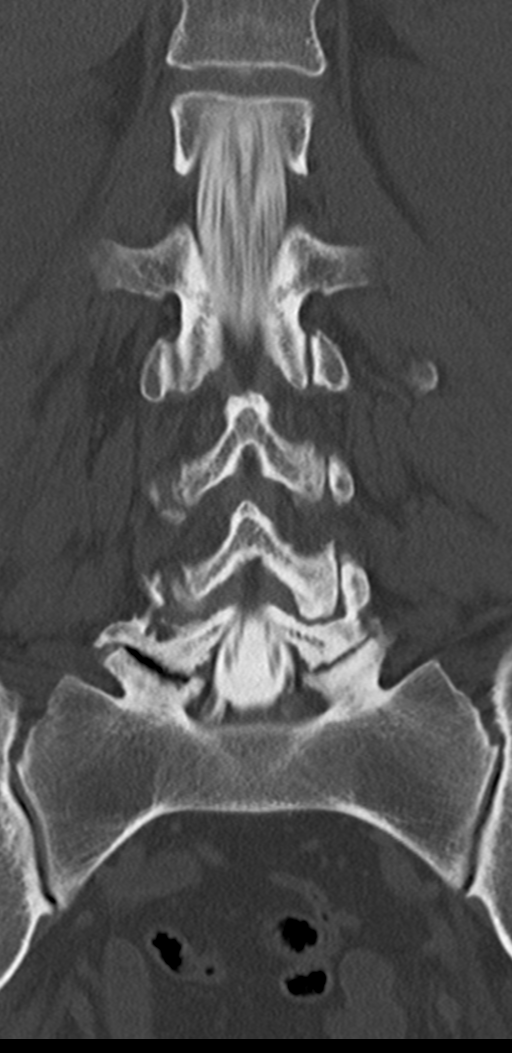

[Series 8: sag bone · sagittal · 0.25mm/px · 5 of 26 slices shown]
[im 9/26  bone]
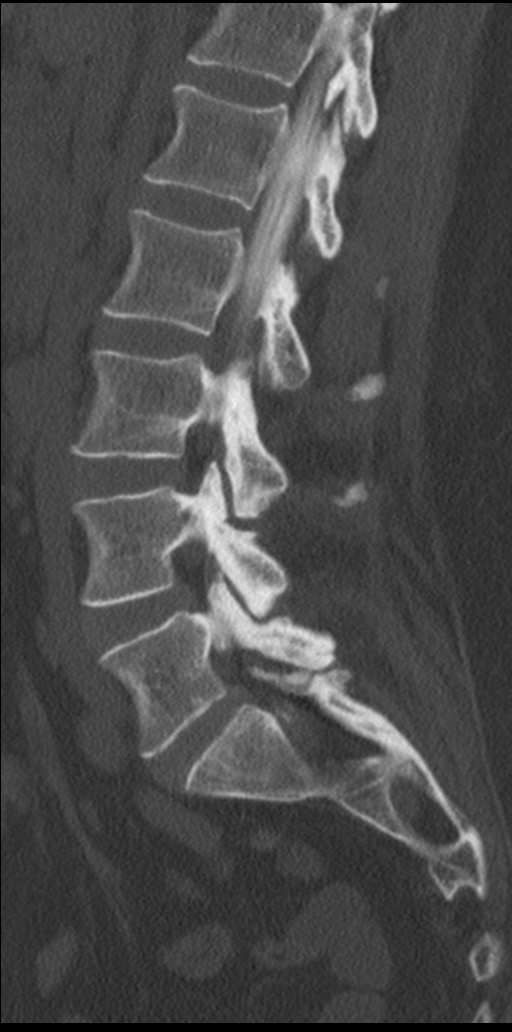
[im 11/26  bone]
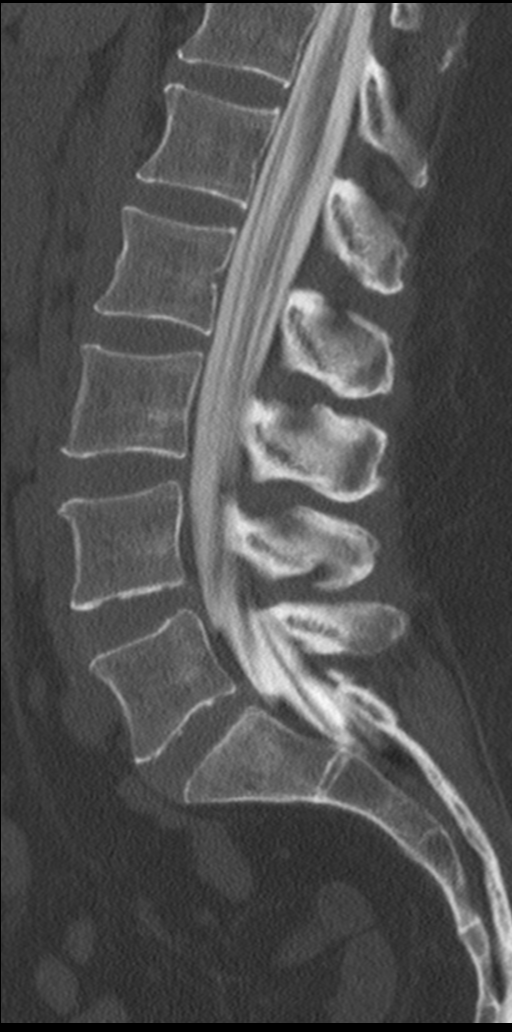
[im 13/26  bone]
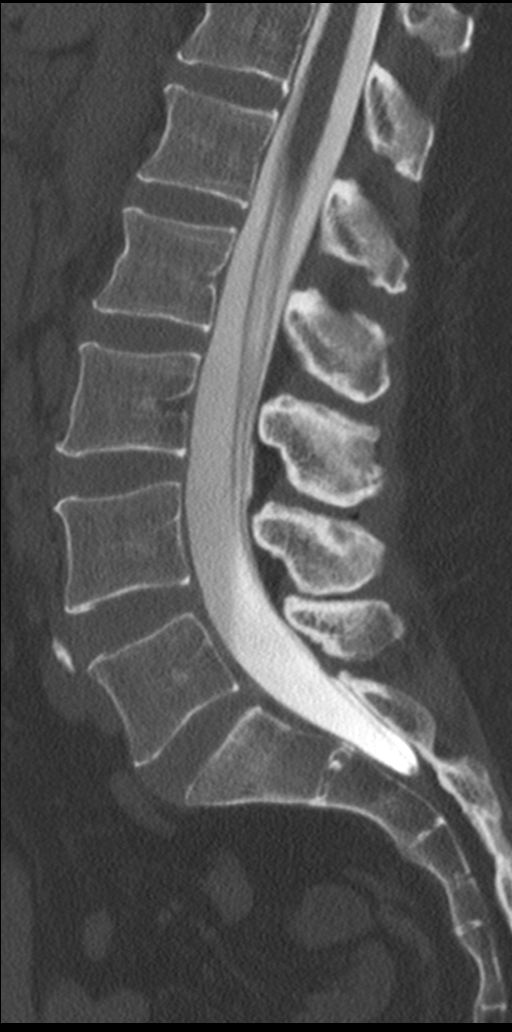
[im 15/26  bone]
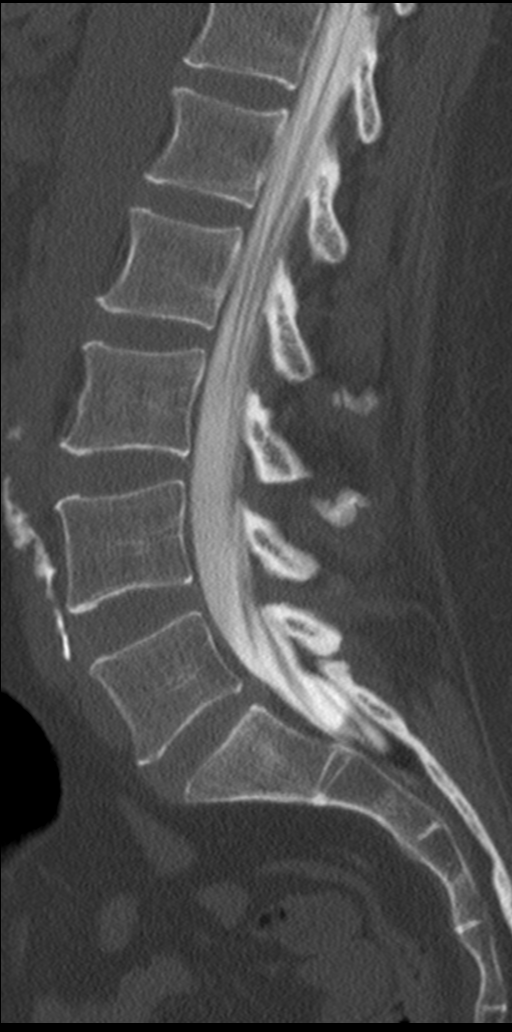
[im 17/26  bone]
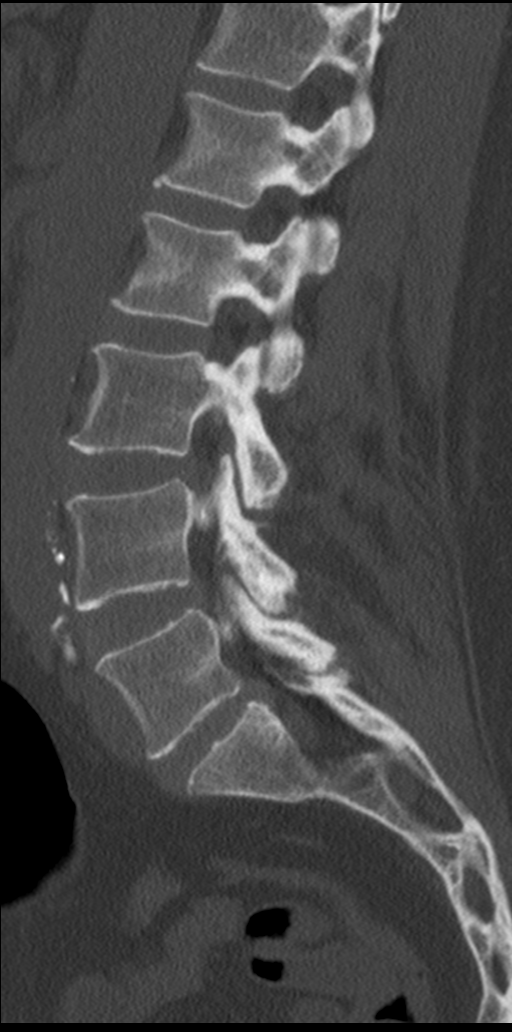

[10 of 33 positions shown; findings below may reference images not displayed]

FINDINGS: Numbering scheme follows that used on prior study. 5 non rib-bearing
lumbar segments labeled L1-L5. Normal alignment.

T12-L1:  Negative

L1-2:  Negative.  Conus terminates behind L1.

L2-3:  Negative

L3-4:  Negative

L4-5: Minimal disc bulge. Early facet DJD bilaterally. No spinal or
foraminal stenosis.

L5-S1: Advanced facet DJD, right worse than left, with spurring and
vacuum phenomenon. Mild circumferential disc bulge with left
posterolateral and foraminal protrusion, contacting the left S1
nerve root sleeve. Bilateral foraminal encroachment. No spinal
stenosis.

Patchy aortoiliac arterial plaque. Surgical clips in the gallbladder
fossa. Remainder visualized paraspinal soft tissues unremarkable.
IMPRESSION: 1. Mild disc bulge and facet disease L4-5 without compressive
pathology.
2. Advanced facet DJD L5-S1 and left broad protrusion, resulting in
bilateral foraminal encroachment and left S1 nerve root sleeve
displacement.

## 2016-06-18 IMAGING — CR DG LUMBAR SPINE COMPLETE 4+V
2 series · 2 of 2 positions shown · non-contrast
Comparison: 05/15/2015

CLINICAL DATA: L5-S1 micro diskectomy

EXAM:
LUMBAR SPINE - COMPLETE 4+ VIEW

[lat (1 of 2)]
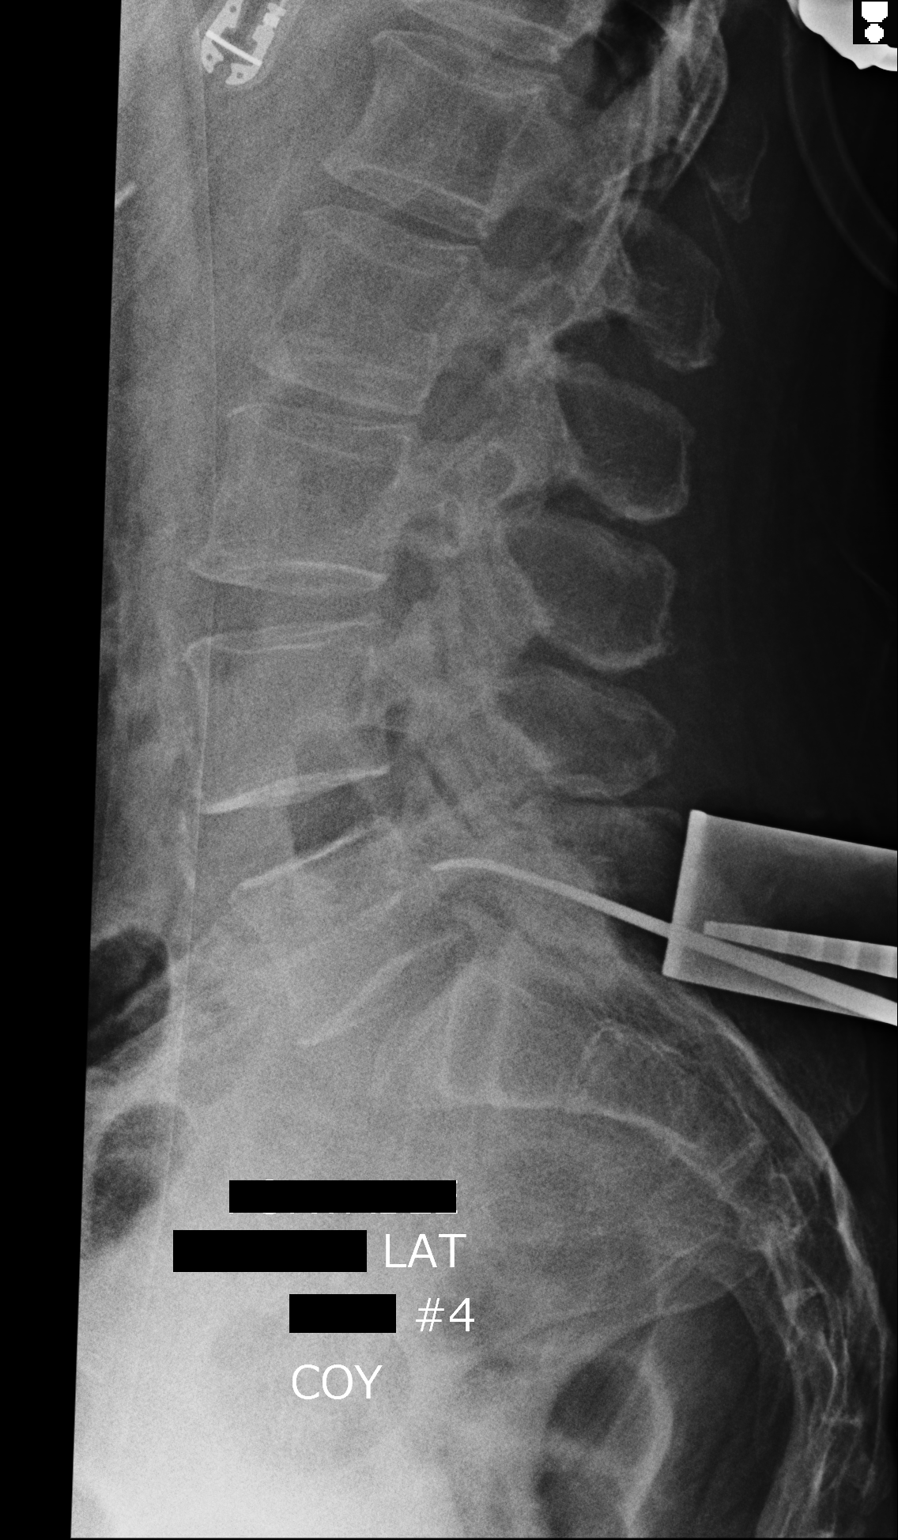

[lat (2 of 2)]
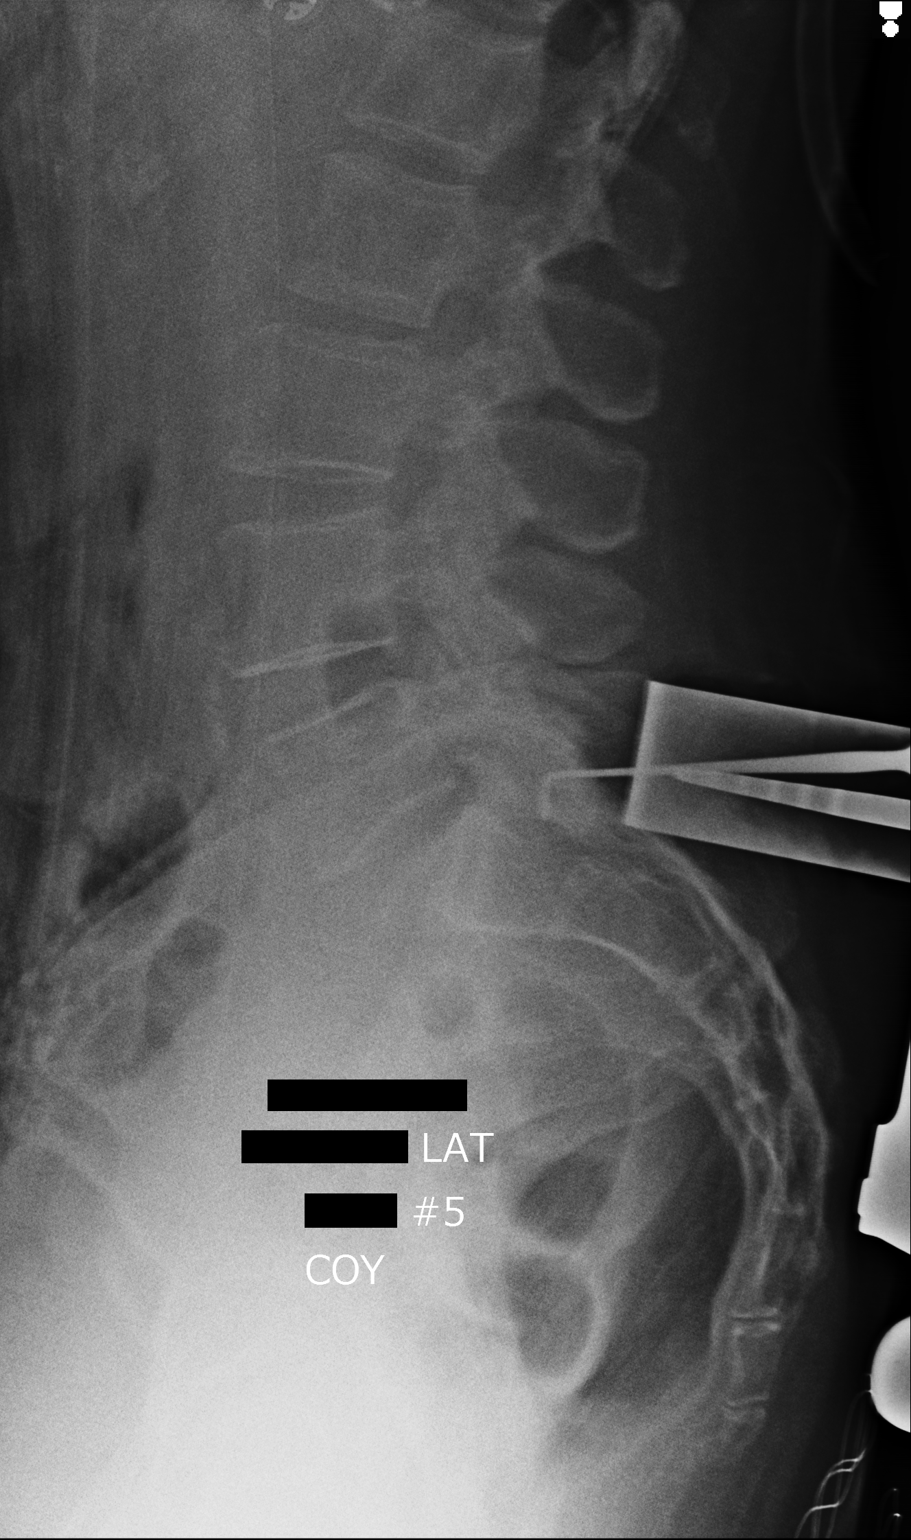

[2 of 2 positions shown; findings below may reference images not displayed]

FINDINGS: Multiple lateral radiographs of the lumbar spine were obtained
intraoperatively. Initial images demonstrate a surgical instrument
in the posterior soft tissues at the L5-S1 interspace. Subsequently
surgical retractors and instruments are noted at this level.
IMPRESSION: Intraoperative localization at L5-S1 for discectomy

## 2020-05-08 ENCOUNTER — Other Ambulatory Visit: Payer: Self-pay | Admitting: Family Medicine

## 2020-05-08 ENCOUNTER — Other Ambulatory Visit (HOSPITAL_COMMUNITY): Payer: Self-pay | Admitting: Family Medicine

## 2020-05-08 DIAGNOSIS — Z1231 Encounter for screening mammogram for malignant neoplasm of breast: Secondary | ICD-10-CM

## 2020-05-08 DIAGNOSIS — R7989 Other specified abnormal findings of blood chemistry: Secondary | ICD-10-CM

## 2020-05-14 ENCOUNTER — Other Ambulatory Visit: Payer: Self-pay

## 2020-05-14 ENCOUNTER — Ambulatory Visit (HOSPITAL_COMMUNITY)
Admission: RE | Admit: 2020-05-14 | Discharge: 2020-05-14 | Disposition: A | Discharge status: Home / Self Care | Payer: Medicare Other | Source: Ambulatory Visit | Attending: Family Medicine | Admitting: Family Medicine

## 2020-05-14 DIAGNOSIS — R945 Abnormal results of liver function studies: Secondary | ICD-10-CM | POA: Insufficient documentation

## 2020-05-14 DIAGNOSIS — R7989 Other specified abnormal findings of blood chemistry: Secondary | ICD-10-CM

## 2020-12-12 IMAGING — US US ABDOMEN COMPLETE
1 series · 14 of 25 positions shown · non-contrast
Comparison: Ultrasound report 05/27/2009

CLINICAL DATA: Abnormal liver function test

EXAM:
ABDOMEN ULTRASOUND COMPLETE

[Series 1: us abdomen complete · 14 of 96 slices shown]
[im 1/96]
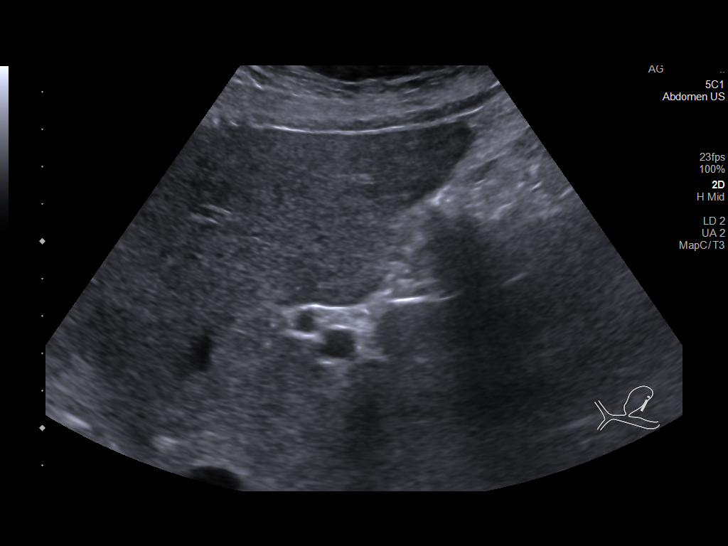
[im 8/96]
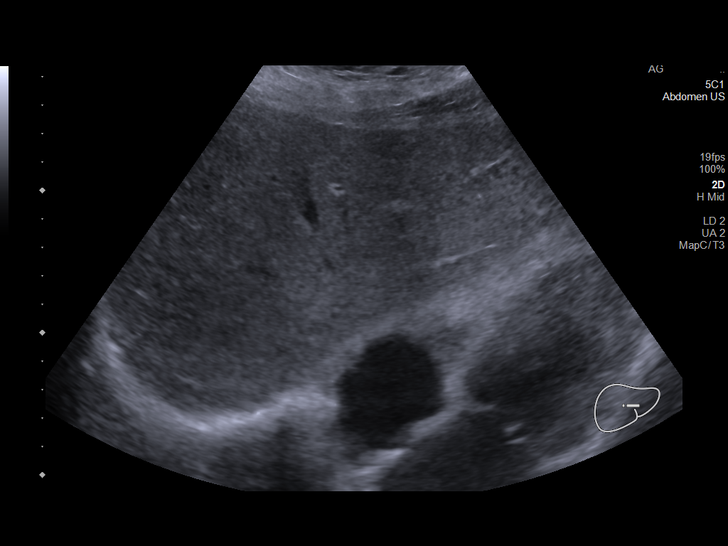
[im 16/96]
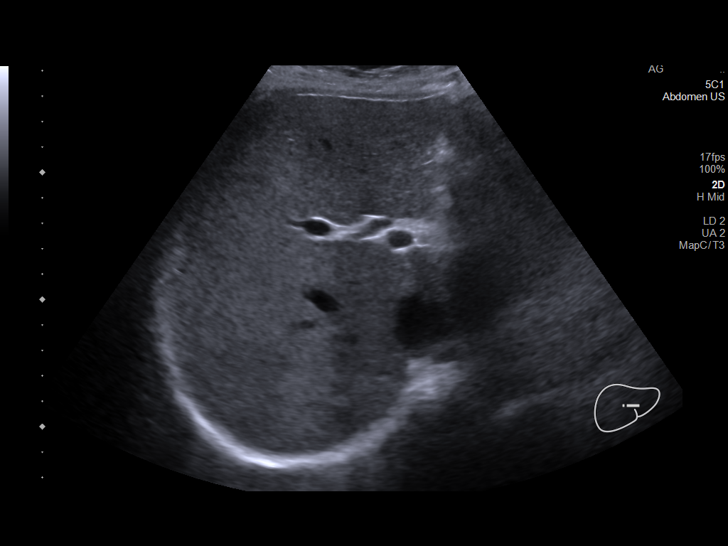
[im 24/96]
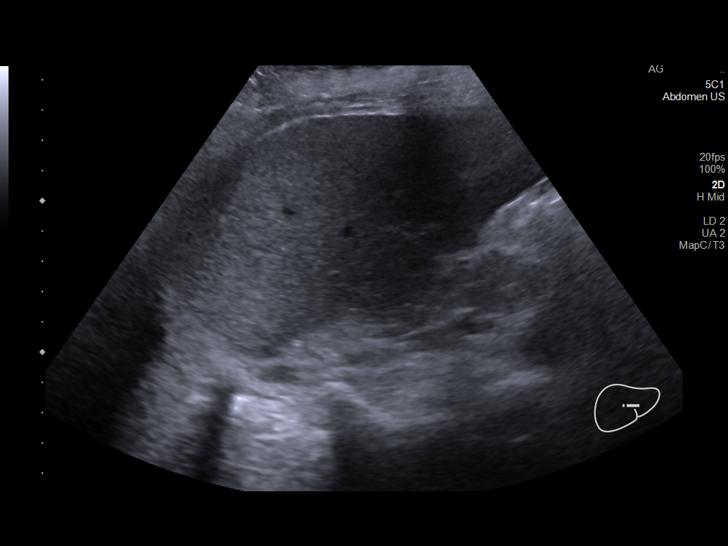
[im 32/96]
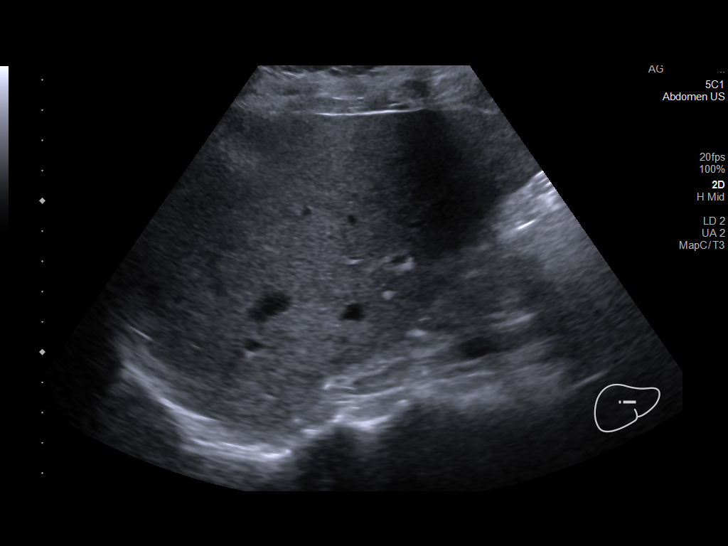
[im 36/96]
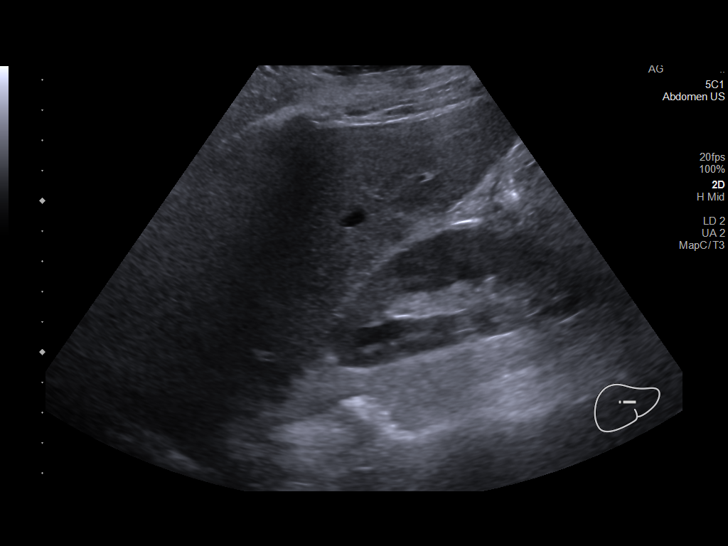
[im 44/96]
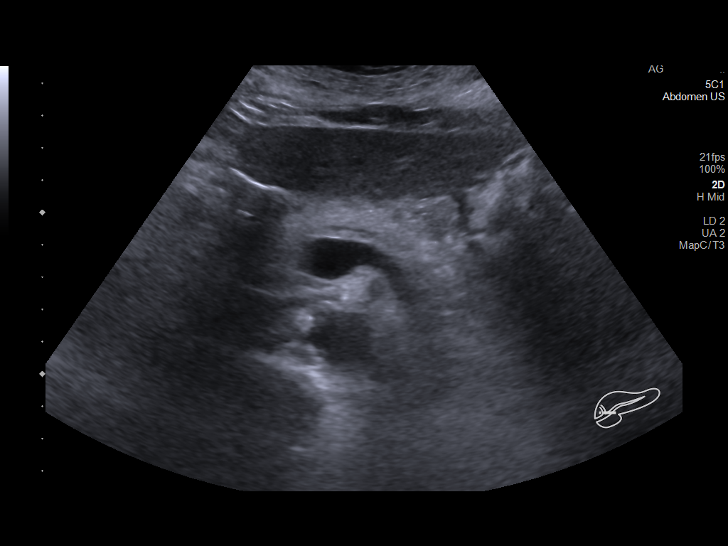
[im 52/96]
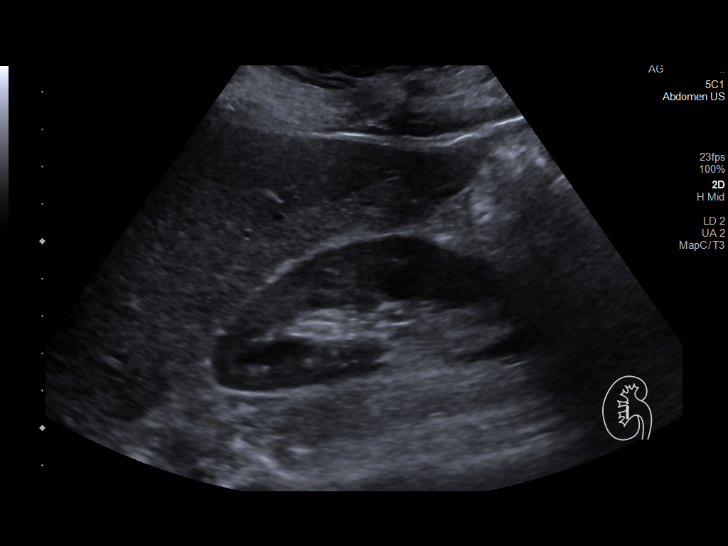
[im 60/96]
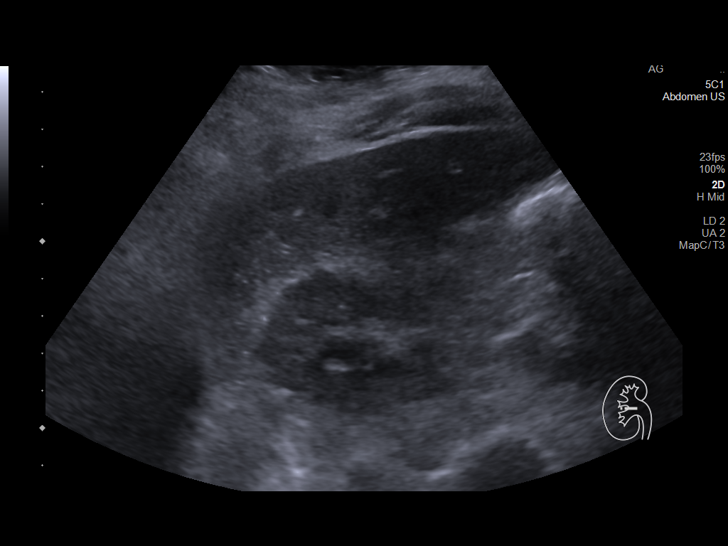
[im 64/96]
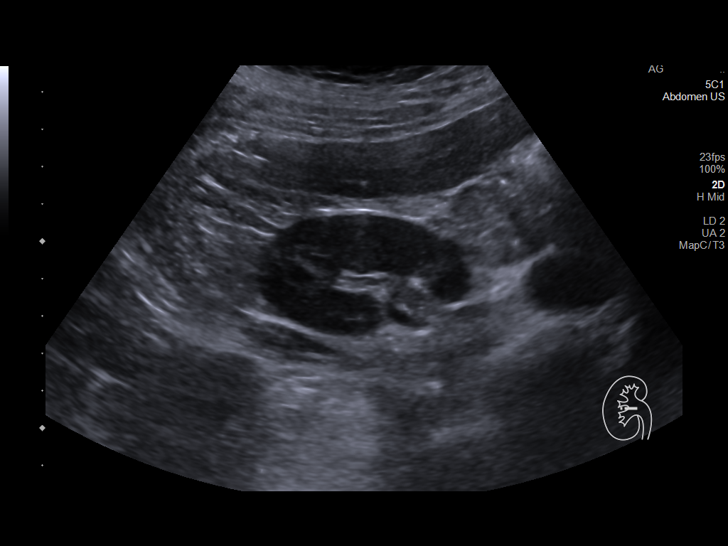
[im 72/96]
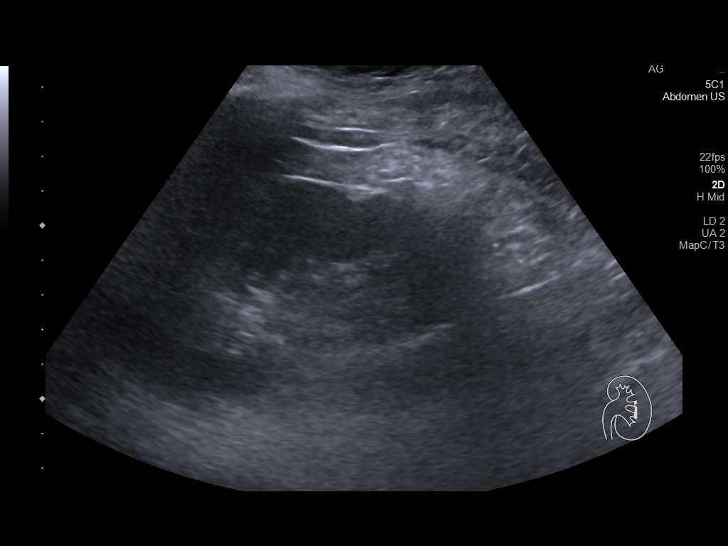
[im 80/96]
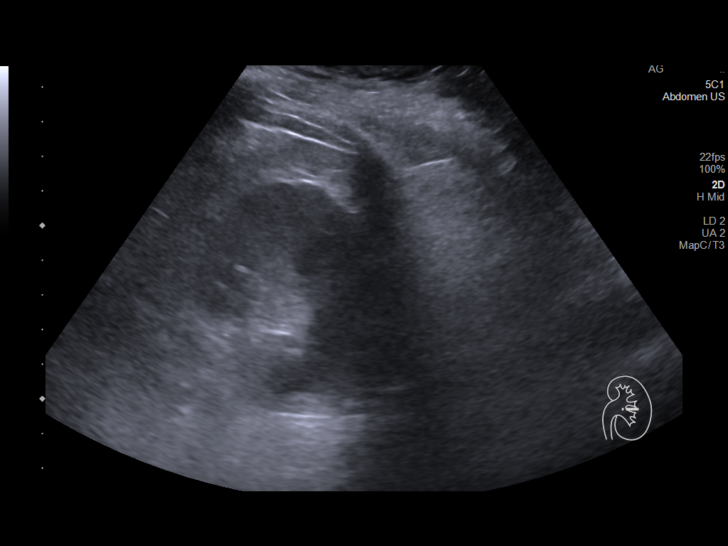
[im 88/96]
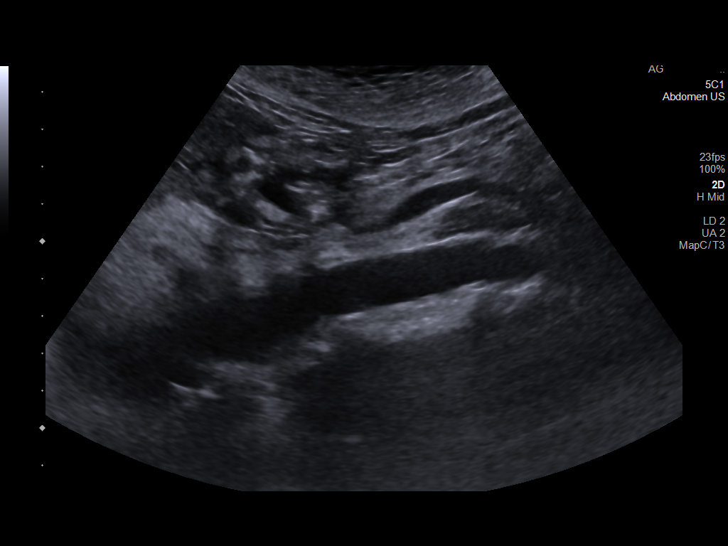
[im 96/96]
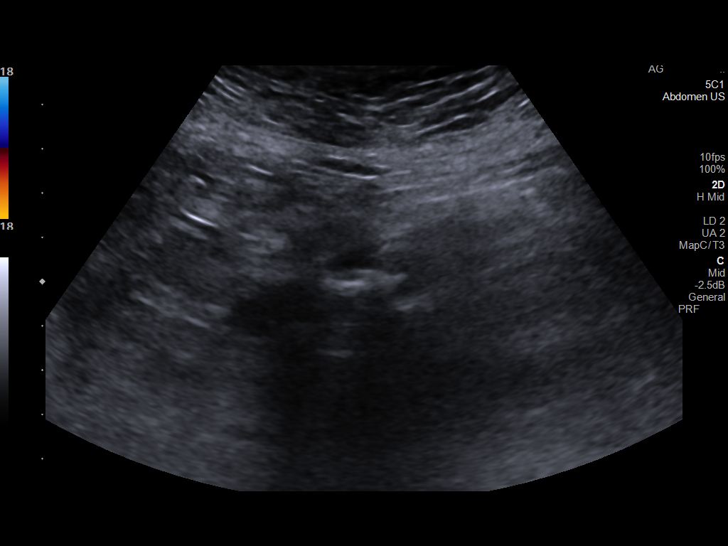

[14 of 25 positions shown; findings below may reference images not displayed]

FINDINGS: Gallbladder: No gallstones or wall thickening visualized. No
sonographic Murphy sign noted by sonographer.

Common bile duct: Diameter: 3.4 mm

Liver: Liver is slightly echogenic. No focal hepatic abnormality.
Portal vein is patent on color Doppler imaging with normal direction
of blood flow towards the liver.

IVC: No abnormality visualized.

Pancreas: Visualized portion unremarkable.

Spleen: Size and appearance within normal limits.

Right Kidney: Length: 9 cm. Echogenicity within normal limits. No
mass or hydronephrosis visualized.

Left Kidney: Length: 9.6 cm. Echogenicity within normal limits. No
mass or hydronephrosis visualized.

Abdominal aorta: No aneurysm visualized.

Other findings: None.
IMPRESSION: 1. Slightly echogenic liver suggesting steatosis.
2. Otherwise negative examination
